# Patient Record
Sex: Female | Born: 2000 | Race: White | Hispanic: No | Marital: Married | Smoking: Never smoker
Health system: Southern US, Community
[De-identification: ages and names within clinical notes are randomized; demographics above are authoritative.]

## PROBLEM LIST (undated history)

## (undated) DIAGNOSIS — D649 Anemia, unspecified: Secondary | ICD-10-CM

## (undated) DIAGNOSIS — F32A Depression, unspecified: Secondary | ICD-10-CM

## (undated) DIAGNOSIS — R519 Headache, unspecified: Secondary | ICD-10-CM

## (undated) DIAGNOSIS — F419 Anxiety disorder, unspecified: Secondary | ICD-10-CM

## (undated) DIAGNOSIS — R51 Headache: Secondary | ICD-10-CM

## (undated) HISTORY — DX: Depression, unspecified: F32.A

## (undated) HISTORY — DX: Anemia, unspecified: D64.9

## (undated) HISTORY — DX: Anxiety disorder, unspecified: F41.9

---
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
## 2018-05-04 ENCOUNTER — Encounter: Payer: Self-pay | Admitting: Neurology

## 2018-05-04 ENCOUNTER — Ambulatory Visit (INDEPENDENT_AMBULATORY_CARE_PROVIDER_SITE_OTHER): Payer: Medicaid Other | Admitting: Neurology

## 2018-05-04 ENCOUNTER — Other Ambulatory Visit: Payer: Self-pay

## 2018-05-04 VITALS — BP 104/70 | HR 91 | Ht 61.0 in | Wt 167.0 lb

## 2018-05-04 DIAGNOSIS — G43009 Migraine without aura, not intractable, without status migrainosus: Secondary | ICD-10-CM | POA: Diagnosis not present

## 2018-05-04 DIAGNOSIS — G43109 Migraine with aura, not intractable, without status migrainosus: Secondary | ICD-10-CM

## 2018-05-04 MED ORDER — SERTRALINE HCL 25 MG PO TABS: 25.0000 mg | ORAL_TABLET | Freq: Every day | ORAL | 3 refills | Status: DC

## 2018-05-04 NOTE — Patient Instructions (Signed)
Great seeing you! Resume Zoloft 25mg  daily. Follow-up in 1 year, call for any changes.

## 2018-05-04 NOTE — Progress Notes (Signed)
NEUROLOGY FOLLOW UP OFFICE NOTE  Miranda Prince [id number] [birth year]  HISTORY OF PRESENT ILLNESS: I had the pleasure of seeing Miranda Prince in follow-up in the neurology clinic on 05/04/2018.  The patient was last seen a year ago for migraines and dizziness. She is alone in the office today. On her last visit, she reported good response to Zoloft but stopping it when she was pregnant. Off medication, migraines and dizziness recurred. She was restarted on Zoloft 25mg  daily and did well until she ran out of medication last month. Since then, she is starting to feel the migraines again. She denied any side effects on Zoloft. She denies any diplopia, focal numbness/tingling/weakness. Sleep is good. She fell at work yesterday, no injuries.   HPI 06/10/2016: This is an 18 yo RH woman who presented with worsening headaches and dizziness. She reports symptoms started intermittently at age 61, but have gotten worse the past 4 weeks, now occurring on a daily basis. She has throbbing headaches mostly over the right parietal region, but also over the frontal/vertex region, with associated nausea and dizziness. She describes the dizziness as either the room spinning or lightheadedness when she walks. Initially they were occurring intermittently with good response to Ibuprofen, however she states that over the past 4 weeks, symptoms have been near-constant. She would have headaches on a daily basis with an intensity of 4 or 5 over 10. The dizziness comes and goes. She has difficulty stating the duration, they mostly worsen when she is doing activities, which has mad it difficult for her to do anything at school. She does report that her grades are fine. She does not take any medication now for these. There is no photo/phonophobia, vomiting, visual obscurations, tinnitus, focal numbness/tingling/weakness in her extremities, but she does have tingling on the right parietal region when it throbs. She has neck and  back pain, no diplopia, dysarthria, bowel/bladder dysfunction. She has noticed some trouble swallowing the past 2 weeks. Her mother used to have migraines. She has not had any brain imaging in the past. She reports mood is "neutral." She reports good sleep except when she has headaches, appetite is good.    PAST MEDICAL HISTORY: Past Medical History:  Diagnosis Date  . Headache     MEDICATIONS: Current Outpatient Medications on File Prior to Visit  Medication Sig Dispense Refill  . calcium carbonate (TUMS - DOSED IN MG ELEMENTAL CALCIUM) 500 MG chewable tablet Chew 1-2 tablets by mouth daily as needed for indigestion or heartburn.    Marland Kitchen ibuprofen (ADVIL,MOTRIN) 600 MG tablet Take 1 tablet (600 mg total) by mouth every 6 (six) hours. (Patient not taking: Reported on 06/10/2017) 60 tablet 0  . sertraline (ZOLOFT) 25 MG tablet Take 1 tablet (25 mg total) by mouth daily. 30 tablet 6   No current facility-administered medications on file prior to visit.     ALLERGIES: No Known Allergies  FAMILY HISTORY: Family History  Problem Relation Age of Onset  . Diabetes Other   . Cancer Other     SOCIAL HISTORY: Social History   Socioeconomic History  . Marital status: Married    Spouse name: Not on file  . Number of children: Not on file  . Years of education: Not on file  . Highest education level: Not on file  Occupational History  . Not on file  Social Needs  . Financial resource strain: Not on file  . Food insecurity:    Worry: Not on file  Inability: Not on file  . Transportation needs:    Medical: Not on file    Non-medical: Not on file  Tobacco Use  . Smoking status: Never Smoker  . Smokeless tobacco: Never Used  Substance and Sexual Activity  . Alcohol use: No  . Drug use: No  . Sexual activity: Not Currently  Lifestyle  . Physical activity:    Days per week: Not on file    Minutes per session: Not on file  . Stress: Not on file  Relationships  . Social  connections:    Talks on phone: Not on file    Gets together: Not on file    Attends religious service: Not on file    Active member of club or organization: Not on file    Attends meetings of clubs or organizations: Not on file    Relationship status: Not on file  . Intimate partner violence:    Fear of current or ex partner: Not on file    Emotionally abused: Not on file    Physically abused: Not on file    Forced sexual activity: Not on file  Other Topics Concern  . Not on file  Social History Narrative  . Not on file    REVIEW OF SYSTEMS: Constitutional: No fevers, chills, or sweats, no generalized fatigue, change in appetite Eyes: No visual changes, double vision, eye pain Ear, nose and throat: No hearing loss, ear pain, nasal congestion, sore throat Cardiovascular: No chest pain, palpitations Respiratory:  No shortness of breath at rest or with exertion, wheezes GastrointestinaI: No nausea, vomiting, diarrhea, abdominal pain, fecal incontinence Genitourinary:  No dysuria, urinary retention or frequency Musculoskeletal:  No neck pain, back pain Integumentary: No rash, pruritus, skin lesions Neurological: as above Psychiatric: No depression, insomnia, anxiety Endocrine: No palpitations, fatigue, diaphoresis, mood swings, change in appetite, change in weight, increased thirst Hematologic/Lymphatic:  No anemia, purpura, petechiae. Allergic/Immunologic: no itchy/runny eyes, nasal congestion, recent allergic reactions, rashes  PHYSICAL EXAM: Vitals:   05/04/18 1558  BP: 104/70  Pulse: 91  SpO2: 99%   General: No acute distress Head:  Normocephalic/atraumatic Neck: supple, no paraspinal tenderness, full range of motion Heart:  Regular rate and rhythm Lungs:  Clear to auscultation bilaterally Back: No paraspinal tenderness Skin/Extremities: No rash, no edema Neurological Exam: alert and oriented to person, place, and time. No aphasia or dysarthria. Fund of knowledge is  appropriate.  Recent and remote memory are intact.  Attention and concentration are normal.    Able to name objects and repeat phrases. Cranial nerves: Pupils equal, round, reactive to light.  Extraocular movements intact with no nystagmus. Visual fields full. Facial sensation intact. No facial asymmetry. Tongue, uvula, palate midline.  Motor: Bulk and tone normal, muscle strength 5/5 throughout with no pronator drift.  Sensation to light touch intact.  No extinction to double simultaneous stimulation.  Deep tendon reflexes 2+ throughout, toes downgoing.  Finger to nose testing intact.  Gait narrow-based and steady, able to tandem walk adequately.  Romberg negative.  IMPRESSION: This is a 18 yo RH woman with a history of headaches and dizziness since childhood suggestive of migraines without aura, who initially presented in 2018 with a change in headaches and dizziness that were occurring on a daily basis. MRI brain normal. 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 She had excellent response to Zoloft but ran out last month. She will restart Zoloft 25mg  daily, refills sent. Follow-up in 1 year, she knows to call for any  changes.   Thank you for allowing me to participate in her care.  Please do not hesitate to call for any questions or concerns.  The duration of this appointment visit was 15 minutes of face-to-face time with the patient.  Greater than 50% of this time was spent in counseling, explanation of diagnosis, planning of further management, and coordination of care.   Patrcia Dolly, M.D.   CC: [facility] Department

## 2018-05-05 ENCOUNTER — Encounter: Payer: Self-pay | Admitting: Neurology

## 2018-05-28 ENCOUNTER — Ambulatory Visit (INDEPENDENT_AMBULATORY_CARE_PROVIDER_SITE_OTHER): Payer: Medicaid Other | Admitting: Physician Assistant

## 2018-05-28 ENCOUNTER — Encounter: Payer: Self-pay | Admitting: Physician Assistant

## 2018-05-28 ENCOUNTER — Other Ambulatory Visit: Payer: Self-pay

## 2018-05-28 ENCOUNTER — Other Ambulatory Visit (HOSPITAL_COMMUNITY)
Admission: RE | Admit: 2018-05-28 | Discharge: 2018-05-28 | Disposition: A | Discharge status: Home / Self Care | Payer: Self-pay | Source: Ambulatory Visit | Attending: Physician Assistant | Admitting: Physician Assistant

## 2018-05-28 VITALS — BP 101/68 | HR 80 | Temp 98.6°F | Wt 164.4 lb

## 2018-05-28 DIAGNOSIS — Z23 Encounter for immunization: Secondary | ICD-10-CM | POA: Diagnosis not present

## 2018-05-28 DIAGNOSIS — Z0000 Encounter for general adult medical examination without abnormal findings: Secondary | ICD-10-CM

## 2018-05-28 DIAGNOSIS — Z113 Encounter for screening for infections with a predominantly sexual mode of transmission: Secondary | ICD-10-CM | POA: Diagnosis not present

## 2018-05-28 DIAGNOSIS — N912 Amenorrhea, unspecified: Secondary | ICD-10-CM

## 2018-05-28 NOTE — Progress Notes (Signed)
Patient: Miranda Prince, Female    DOB: 2001-01-31, 18 y.o.   MRN: [id number] Visit Date: 05/28/2018  Today's Provider: Trey Sailors, PA-C   Chief Complaint  Patient presents with  . New Patient (Initial Visit)   Subjective:    New Patient Appointment Miranda Prince is a 18 y.o. female who presents today for new patient appointment. She feels well. She reports exercising trying. She reports she is sleeping well.  Daughter aged 1. Living in [place], husband and daughter. Hostess at [facility] Neurologist Dr. Patrcia Dolly at [facility] in [place] follows her for dizziness, migraines, gives her zoloft 25 mg for migraines and dizziness for greater than one year. Has stopped when she was pregnant. This helps her.  Sexually active. Does not use any birth control, does not use condoms. Does not desire birth control.  Amenorrhea December started on 26 and ended 29. Reports she has not had period since this. Took five pregnancy tests which were negative. Reports they will be late period sometimes, then sometimes every 2 weeks. First period at age 57 and had normal periods. Just one child, Did have periods after childbirth. Not breast feeding. -----------------------------------------------------------------  Review of Systems  Constitutional: Negative.   HENT: Negative.   Eyes: Negative.   Respiratory: Negative.   Cardiovascular: Negative.   Gastrointestinal: Positive for anal bleeding.  Endocrine: Negative.   Genitourinary: Negative.   Musculoskeletal: Negative.   Skin: Negative.   Allergic/Immunologic: Negative.   Neurological: Negative.   Hematological: Negative.   Psychiatric/Behavioral: Negative.     Social History She  reports that she has never smoked. She has never used smokeless tobacco. She reports that she does not drink alcohol or use drugs. Social History   Socioeconomic History  . Marital status: Married    Spouse name: Not on file   . Number of children: Not on file  . Years of education: Not on file  . Highest education level: Not on file  Occupational History  . Not on file  Social Needs  . Financial resource strain: Not on file  . Food insecurity:    Worry: Not on file    Inability: Not on file  . Transportation needs:    Medical: Not on file    Non-medical: Not on file  Tobacco Use  . Smoking status: Never Smoker  . Smokeless tobacco: Never Used  Substance and Sexual Activity  . Alcohol use: No  . Drug use: No  . Sexual activity: Not Currently  Lifestyle  . Physical activity:    Days per week: Not on file    Minutes per session: Not on file  . Stress: Not on file  Relationships  . Social connections:    Talks on phone: Not on file    Gets together: Not on file    Attends religious service: Not on file    Active member of club or organization: Not on file    Attends meetings of clubs or organizations: Not on file    Relationship status: Not on file  Other Topics Concern  . Not on file  Social History Narrative  . Not on file    Patient Active Problem List   Diagnosis Date Noted  . PROM (premature rupture of membranes) 04/08/2017  . SVD (spontaneous vaginal delivery) 04/08/2017  . Migraine without aura and without status migrainosus, not intractable 06/10/2016  . 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 No pertinent surgical history.  Family  History  Family Status  Relation Name Status  . Other  (Not Specified)  . Mother  (Not Specified)  . Mat Uncle  (Not Specified)  . Emelda Brothers  (Not Specified)  . MGM  (Not Specified)  . PGF  (Not Specified)   Her family history includes Anxiety disorder in her paternal aunt; Asthma in her paternal aunt; COPD in her paternal aunt; Cancer in an other family member; Depression in her paternal aunt; Diabetes in her maternal uncle, mother, and another family member; Seizures in her maternal grandmother; Stroke in her paternal grandfather.      No Known Allergies  Previous Medications   CALCIUM CARBONATE (TUMS - DOSED IN MG ELEMENTAL CALCIUM) 500 MG CHEWABLE TABLET    Chew 1-2 tablets by mouth daily as needed for indigestion or heartburn.   IBUPROFEN (ADVIL,MOTRIN) 600 MG TABLET    Take 1 tablet (600 mg total) by mouth every 6 (six) hours.   SERTRALINE (ZOLOFT) 25 MG TABLET    Take 1 tablet (25 mg total) by mouth daily.    Patient Care Team: Department, [facility] as PCP - General      Objective:   Vitals: BP 101/68 (BP Location: Left Arm, Patient Position: Sitting, Cuff Size: Normal)   Pulse 80   Temp 98.6 F (37 C) (Oral)   Wt 164 lb 6.4 oz (74.6 kg)   SpO2 99%   BMI 31.06 kg/m    Physical Exam Constitutional:      Appearance: Normal appearance.  Cardiovascular:     Rate and Rhythm: Normal rate and regular rhythm.     Heart sounds: Normal heart sounds.  Pulmonary:     Effort: Pulmonary effort is normal.     Breath sounds: Normal breath sounds.  Abdominal:     General: Abdomen is flat.     Palpations: Abdomen is soft.  Skin:    General: Skin is warm and dry.  Neurological:     Mental Status: She is alert and oriented to person, place, and time. Mental status is at baseline.  Psychiatric:        Mood and Affect: Mood normal.        Behavior: Behavior normal.      Depression Screen PHQ 2/9 Scores 05/28/2018  PHQ - 2 Score 0  PHQ- 9 Score 0      Assessment & Plan:     Routine Health Maintenance and Physical Exam  Exercise Activities and Dietary recommendations Goals   None     There is no immunization history for the selected administration types on file for this patient.  Health Maintenance  Topic Date Due  . TETANUS/TDAP  04/28/2017  . INFLUENZA VACCINE  10/16/2017  . HIV Screening  Completed     Discussed health benefits of physical activity, and encouraged her to engage in regular exercise appropriate for her age and condition.     --------------------------------------------------------------------

## 2018-05-30 LAB — COMPREHENSIVE METABOLIC PANEL
ALT: 22 IU/L (ref 0–32)
AST: 22 IU/L (ref 0–40)
Albumin/Globulin Ratio: 1.4 (ref 1.2–2.2)
Albumin: 4.5 g/dL (ref 3.9–5.0)
Alkaline Phosphatase: 99 IU/L (ref 39–117)
BUN/Creatinine Ratio: 15 (ref 9–23)
BUN: 11 mg/dL (ref 6–20)
Bilirubin Total: 0.5 mg/dL (ref 0.0–1.2)
CO2: 22 mmol/L (ref 20–29)
Calcium: 9.3 mg/dL (ref 8.7–10.2)
Chloride: 102 mmol/L (ref 96–106)
Creatinine, Ser: 0.71 mg/dL (ref 0.57–1.00)
GFR calc Af Amer: 142 mL/min/{1.73_m2} (ref >59)
GFR calc non Af Amer: 123 mL/min/{1.73_m2} (ref >59)
Globulin, Total: 3.3 g/dL (ref 1.5–4.5)
Glucose: 82 mg/dL (ref 65–99)
Potassium: 4.2 mmol/L (ref 3.5–5.2)
Sodium: 139 mmol/L (ref 134–144)
Total Protein: 7.8 g/dL (ref 6.0–8.5)

## 2018-05-30 LAB — CBC WITH DIFFERENTIAL/PLATELET
Basophils Absolute: 0.1 10*3/uL (ref 0.0–0.2)
Basos: 1 %
EOS (ABSOLUTE): 0.3 10*3/uL (ref 0.0–0.4)
Eos: 4 %
Hematocrit: 38.8 % (ref 34.0–46.6)
Hemoglobin: 13.1 g/dL (ref 11.1–15.9)
Immature Grans (Abs): 0 10*3/uL (ref 0.0–0.1)
Immature Granulocytes: 0 %
Lymphocytes Absolute: 2.3 10*3/uL (ref 0.7–3.1)
Lymphs: 32 %
MCH: 27.3 pg (ref 26.6–33.0)
MCHC: 33.8 g/dL (ref 31.5–35.7)
MCV: 81 fL (ref 79–97)
Monocytes Absolute: 0.5 10*3/uL (ref 0.1–0.9)
Monocytes: 7 %
Neutrophils Absolute: 4 10*3/uL (ref 1.4–7.0)
Neutrophils: 56 %
Platelets: 381 10*3/uL (ref 150–450)
RBC: 4.79 x10E6/uL (ref 3.77–5.28)
RDW: 13.4 % (ref 11.7–15.4)
WBC: 7.1 10*3/uL (ref 3.4–10.8)

## 2018-05-30 LAB — LUTEINIZING HORMONE: LH: 18.6 m[IU]/mL

## 2018-05-30 LAB — PROLACTIN: Prolactin: 19.3 ng/mL (ref 4.8–23.3)

## 2018-05-30 LAB — BETA HCG QUANT (REF LAB): hCG Quant: <1 m[IU]/mL

## 2018-05-30 LAB — DHEA: Dehydroepiandrosterone: 1928 ng/dL — ABNORMAL HIGH (ref 31–701)

## 2018-05-30 LAB — TSH: TSH: 0.756 u[IU]/mL (ref 0.450–4.500)

## 2018-05-30 LAB — FOLLICLE STIMULATING HORMONE: FSH: 6.1 m[IU]/mL

## 2018-05-31 NOTE — Patient Instructions (Addendum)

## 2018-06-01 LAB — URINE CYTOLOGY ANCILLARY ONLY
Chlamydia: NEGATIVE
Neisseria Gonorrhea: NEGATIVE

## 2018-06-04 ENCOUNTER — Telehealth: Payer: Self-pay | Admitting: Physician Assistant

## 2018-06-04 NOTE — Telephone Encounter (Signed)
LMTCB

## 2018-06-04 NOTE — Telephone Encounter (Signed)
Can you results her labs please. Thanks.

## 2018-06-04 NOTE — Telephone Encounter (Signed)
-----   Message from Trey Sailors, [place] sent at 06/04/2018  4:48 PM EDT ----- Pregnancy test negative. Most of her hormones were normal except for one, DHEA, which was elevated. I think she should be referred to an endocrinologist for further evaluation of why this is elevated. I can place referral.

## 2018-06-04 NOTE — Telephone Encounter (Signed)
Please call pt back regarding her recent lab results.  Thanks, [employer]

## 2018-06-05 ENCOUNTER — Encounter: Payer: Self-pay | Admitting: Physician Assistant

## 2018-06-05 NOTE — Telephone Encounter (Signed)
Replied through my chart message.

## 2018-06-06 ENCOUNTER — Encounter: Payer: Self-pay | Admitting: Physician Assistant

## 2018-06-22 ENCOUNTER — Encounter: Payer: Self-pay | Admitting: Physician Assistant

## 2018-06-22 DIAGNOSIS — R7989 Other specified abnormal findings of blood chemistry: Secondary | ICD-10-CM

## 2018-06-22 DIAGNOSIS — N912 Amenorrhea, unspecified: Secondary | ICD-10-CM

## 2018-06-22 DIAGNOSIS — E278 Other specified disorders of adrenal gland: Secondary | ICD-10-CM

## 2018-07-28 ENCOUNTER — Encounter: Payer: Self-pay | Admitting: Physician Assistant

## 2018-12-11 ENCOUNTER — Ambulatory Visit (INDEPENDENT_AMBULATORY_CARE_PROVIDER_SITE_OTHER): Payer: Medicaid Other | Admitting: Certified Nurse Midwife

## 2018-12-11 ENCOUNTER — Other Ambulatory Visit: Payer: Self-pay

## 2018-12-11 VITALS — BP 109/73 | HR 97 | Ht 61.0 in | Wt 164.5 lb

## 2018-12-11 DIAGNOSIS — Z3401 Encounter for supervision of normal first pregnancy, first trimester: Secondary | ICD-10-CM

## 2018-12-11 NOTE — Progress Notes (Signed)
Aldona Bar Gladu presents for NOB nurse interview visit. Pregnancy confirmation done 12/10/2018 G2 P1001 Pregnancy education material explained and given. 0 cats in home. NOB labs ordered.  HIV labs and drug screen were explained and ordered. PNV encouraged. Genetic screening options discussed. Genetic testing: Unsure. Patient may discuss with the provider. Patient to follow up with provider in 2 weeks for NOB physical. All questions answered.  Pregnancy Confirmation was done at [facility] Department. Document will be scanned in to. Patient is taking OTC prenatal vitamins.

## 2018-12-12 LAB — URINALYSIS, ROUTINE W REFLEX MICROSCOPIC
Bilirubin, UA: NEGATIVE
Glucose, UA: NEGATIVE
Ketones, UA: NEGATIVE
Nitrite, UA: POSITIVE — AB
Protein,UA: NEGATIVE
RBC, UA: NEGATIVE
Specific Gravity, UA: 1.018 (ref 1.005–1.030)
Urobilinogen, Ur: 0.2 mg/dL (ref 0.2–1.0)
pH, UA: 6 (ref 5.0–7.5)

## 2018-12-12 LAB — MICROSCOPIC EXAMINATION: Casts: NONE SEEN /lpf

## 2018-12-13 LAB — RPR: RPR Ser Ql: NONREACTIVE

## 2018-12-13 LAB — ABO AND RH: Rh Factor: POSITIVE

## 2018-12-13 LAB — RUBELLA SCREEN: Rubella Antibodies, IGG: 7.09 index (ref >0.99)

## 2018-12-13 LAB — HEPATITIS B SURFACE ANTIGEN: Hepatitis B Surface Ag: NEGATIVE

## 2018-12-13 LAB — HIV ANTIBODY (ROUTINE TESTING W REFLEX): HIV Screen 4th Generation wRfx: NONREACTIVE

## 2018-12-13 LAB — ANTIBODY SCREEN: Antibody Screen: NEGATIVE

## 2018-12-13 LAB — VARICELLA ZOSTER ANTIBODY, IGG: Varicella zoster IgG: 3010 index (ref >165)

## 2018-12-14 LAB — MONITOR DRUG PROFILE 14(MW)
Amphetamine Scrn, Ur: NEGATIVE ng/mL
BARBITURATE SCREEN URINE: NEGATIVE ng/mL
BENZODIAZEPINE SCREEN, URINE: NEGATIVE ng/mL
Buprenorphine, Urine: NEGATIVE ng/mL
CANNABINOIDS UR QL SCN: NEGATIVE ng/mL
Cocaine (Metab) Scrn, Ur: NEGATIVE ng/mL
Creatinine(Crt), U: 85.6 mg/dL (ref 20.0–300.0)
Fentanyl, Urine: NEGATIVE pg/mL
Meperidine Screen, Urine: NEGATIVE ng/mL
Methadone Screen, Urine: NEGATIVE ng/mL
OXYCODONE+OXYMORPHONE UR QL SCN: NEGATIVE ng/mL
Opiate Scrn, Ur: NEGATIVE ng/mL
Ph of Urine: 6.7 (ref 4.5–8.9)
Phencyclidine Qn, Ur: NEGATIVE ng/mL
Propoxyphene Scrn, Ur: NEGATIVE ng/mL
SPECIFIC GRAVITY: 1.021
Tramadol Screen, Urine: NEGATIVE ng/mL

## 2018-12-15 ENCOUNTER — Other Ambulatory Visit (INDEPENDENT_AMBULATORY_CARE_PROVIDER_SITE_OTHER): Payer: Self-pay | Admitting: Certified Nurse Midwife

## 2018-12-15 ENCOUNTER — Encounter: Payer: Self-pay | Admitting: Certified Nurse Midwife

## 2018-12-15 DIAGNOSIS — B9620 Unspecified Escherichia coli [E. coli] as the cause of diseases classified elsewhere: Secondary | ICD-10-CM | POA: Insufficient documentation

## 2018-12-15 DIAGNOSIS — N390 Urinary tract infection, site not specified: Secondary | ICD-10-CM

## 2018-12-15 LAB — GC/CHLAMYDIA PROBE AMP
Chlamydia trachomatis, NAA: NEGATIVE
Neisseria Gonorrhoeae by PCR: NEGATIVE

## 2018-12-15 LAB — CULTURE, OB URINE

## 2018-12-15 LAB — URINE CULTURE, OB REFLEX

## 2018-12-15 MED ORDER — NITROFURANTOIN MONOHYD MACRO 100 MG PO CAPS: 100.0000 mg | ORAL_CAPSULE | Freq: Two times a day (BID) | ORAL | 1 refills | Status: DC

## 2018-12-15 NOTE — Progress Notes (Signed)
E.coli UTI, see chart. Rx: Macrobid, see orders.    Diona Fanti, CNM [facility], [facility] 12/15/18 1:53 PM

## 2018-12-24 ENCOUNTER — Ambulatory Visit (INDEPENDENT_AMBULATORY_CARE_PROVIDER_SITE_OTHER): Payer: Medicaid Other | Admitting: Certified Nurse Midwife

## 2018-12-24 ENCOUNTER — Encounter: Payer: Self-pay | Admitting: Certified Nurse Midwife

## 2018-12-24 ENCOUNTER — Ambulatory Visit (INDEPENDENT_AMBULATORY_CARE_PROVIDER_SITE_OTHER): Payer: Medicaid Other

## 2018-12-24 ENCOUNTER — Other Ambulatory Visit: Payer: Self-pay

## 2018-12-24 VITALS — BP 90/58 | HR 89 | Wt 163.9 lb

## 2018-12-24 DIAGNOSIS — N390 Urinary tract infection, site not specified: Secondary | ICD-10-CM | POA: Diagnosis not present

## 2018-12-24 DIAGNOSIS — B9620 Unspecified Escherichia coli [E. coli] as the cause of diseases classified elsewhere: Secondary | ICD-10-CM | POA: Diagnosis not present

## 2018-12-24 DIAGNOSIS — O99891 Other specified diseases and conditions complicating pregnancy: Secondary | ICD-10-CM | POA: Diagnosis not present

## 2018-12-24 DIAGNOSIS — Z3687 Encounter for antenatal screening for uncertain dates: Secondary | ICD-10-CM

## 2018-12-24 DIAGNOSIS — Z3401 Encounter for supervision of normal first pregnancy, first trimester: Secondary | ICD-10-CM

## 2018-12-24 DIAGNOSIS — Z3491 Encounter for supervision of normal pregnancy, unspecified, first trimester: Secondary | ICD-10-CM

## 2018-12-24 DIAGNOSIS — Z3A12 12 weeks gestation of pregnancy: Secondary | ICD-10-CM | POA: Diagnosis not present

## 2018-12-24 LAB — POCT URINALYSIS DIPSTICK OB
Bilirubin, UA: NEGATIVE
Glucose, UA: NEGATIVE
Ketones, UA: NEGATIVE
Nitrite, UA: POSITIVE
POC,PROTEIN,UA: NEGATIVE
Spec Grav, UA: 1.015 (ref 1.010–1.025)
Urobilinogen, UA: 0.2 E.U./dL
pH, UA: 6 (ref 5.0–8.0)

## 2018-12-24 NOTE — Progress Notes (Addendum)
NEW OB HISTORY AND PHYSICAL  SUBJECTIVE:       Miranda Prince is a 18 y.o. G39P1001 female, Patient's last menstrual period was 10/05/2018., Estimated Date of Delivery: 07/07/19, [redacted]w[redacted]d, presents today for establishment of Prenatal Care.  No questions or concerns. Declines genetic screening. Desires midwifery care.   Taking Zoloft for migraines, wishes to continue medication through pregnancy.   Denies difficulty breathing or respiratory distress, chest pain, abdominal pain, vaginal bleeding, dysuria, and leg pain or swelling.    Gynecologic History  Patient's last menstrual period was 10/05/2018.   Contraception: none   Last Pap: N/A  Obstetric History  OB History  Gravida Para Term Preterm AB Living  2 1 1     1   SAB TAB Ectopic Multiple Live Births        0 1    # Outcome Date GA Lbr Len/2nd Weight Sex Delivery Anes PTL Lv  2 Current           1 Term 04/08/17 [redacted]w[redacted]d / 00:26 7 lb 2.8 oz (3.255 kg) F Vag-Spont EPI  LIV    Past Medical History:  Diagnosis Date  . Headache     No past surgical history on file.  Current Outpatient Medications on File Prior to Visit  Medication Sig Dispense Refill  . Prenatal Vit-Fe Fumarate-FA (PRENATAL MULTIVITAMIN) TABS tablet Take 1 tablet by mouth daily at 12 noon.    . sertraline (ZOLOFT) 25 MG tablet Take 1 tablet (25 mg total) by mouth daily. 90 tablet 3  . nitrofurantoin, macrocrystal-monohydrate, (MACROBID) 100 MG capsule Take 1 capsule (100 mg total) by mouth 2 (two) times daily. (Patient not taking: Reported on 12/24/2018) 14 capsule 1   No current facility-administered medications on file prior to visit.     No Known Allergies  Social History   Socioeconomic History  . Marital status: Married    Spouse name: Not on file  . Number of children: Not on file  . Years of education: Not on file  . Highest education level: Not on file  Occupational History  . Not on file  Social Needs  . Financial resource strain: Not on  file  . Food insecurity    Worry: Not on file    Inability: Not on file  . Transportation needs    Medical: Not on file    Non-medical: Not on file  Tobacco Use  . Smoking status: Never Smoker  . Smokeless tobacco: Never Used  Substance and Sexual Activity  . Alcohol use: No  . Drug use: No  . Sexual activity: Not Currently  Lifestyle  . Physical activity    Days per week: Not on file    Minutes per session: Not on file  . Stress: Not on file  Relationships  . Social 02/23/2019 on phone: Not on file    Gets together: Not on file    Attends religious service: Not on file    Active member of club or organization: Not on file    Attends meetings of clubs or organizations: Not on file    Relationship status: Not on file  . Intimate partner violence    Fear of current or ex partner: Not on file    Emotionally abused: Not on file    Physically abused: Not on file    Forced sexual activity: Not on file  Other Topics Concern  . Not on file  Social History Narrative  . Not on file  Family History  Problem Relation Age of Onset  . Diabetes Other   . Cancer Other   . Diabetes Mother   . Diabetes Maternal Uncle   . Depression Paternal Aunt   . Asthma Paternal Aunt   . COPD Paternal 32   . Anxiety disorder Paternal Aunt   . Seizures Maternal Grandmother   . Stroke Paternal Grandfather     The following portions of the patient's history were reviewed and updated as appropriate: allergies, current medications, past OB history, past medical history, past surgical history, past family history, past social history, and problem list.  Review of Systems:  ROS negative except as noted above. Information obtained from patient.   OBJECTIVE:  BP (!) 90/58   Pulse 89   Wt 163 lb 14.4 oz (74.3 kg)   LMP 10/05/2018   BMI 30.97 kg/m   Initial Physical Exam (New OB)  GENERAL APPEARANCE: alert, well appearing, in no apparent distress  HEAD: normocephalic,  atraumatic  MOUTH: mucous membranes moist, pharynx normal without lesions  THYROID: no thyromegaly or masses present  BREASTS: no masses noted, no significant tenderness, no palpable axillary nodes, no skin changes  LUNGS: clear to auscultation, no wheezes, rales or rhonchi, symmetric air entry  HEART: regular rate and rhythm, no murmurs  ABDOMEN: soft, nontender, nondistended, no abnormal masses, no epigastric pain and FHT present  EXTREMITIES: no redness or tenderness in the calves or thighs, no edema  SKIN: normal coloration and turgor, no rashes  LYMPH NODES: no adenopathy palpable  NEUROLOGIC: alert, oriented, normal speech, no focal findings or movement disorder noted  PELVIC EXAM EXTERNAL GENITALIA: normal appearing vulva with no masses, tenderness or lesions VAGINA: no abnormal discharge or lesions CERVIX: no lesions or cervical motion tenderness UTERUS: gravid ADNEXA: no masses palpable and nontender OB EXAM PELVIMETRY: appears adequate   ULTRASOUND REPORT  Location: [facility] Date of Service: 12/24/2018   Indications:dating Findings:  Miranda Prince intrauterine pregnancy is visualized with a CRL consistent with [redacted]w[redacted]d gestation, giving an (U/S) EDD of 07/07/2019.  FHR: 165 BPM CRL measurement: 49 mm Yolk sac is visualized and appears normal and early anatomy is normal. Amnion: visualized and appears normal   Right Ovary is normal in appearance. Left Ovary is normal appearance. Corpus luteal cyst:  is not visualized Survey of the adnexa demonstrates no adnexal masses. There is no free peritoneal fluid in the cul de sac.  Impression: 1. [redacted]w[redacted]d Viable Singleton Intrauterine pregnancy by U/S. 2. (U/S) EDD is 07/07/2019.  Recommendations: 1.Clinical correlation with the patient's History and Physical Exam. ASSESSMENT: Normal pregnancy Urinary tract infection in pregnancy-Rx Macrobid Declines genetic screening Desires midwifery care Migraines-Taking  Zoloft daily  PLAN: Prenatal care New OB counseling: The patient has been given an overview regarding routine prenatal care. Recommendations regarding diet, weight gain, and exercise in pregnancy were given. Prenatal testing, optional genetic testing, and ultrasound use in pregnancy were reviewed.  Benefits of Breast Feeding were discussed. The patient is encouraged to consider nursing her baby post partum. See orders

## 2018-12-24 NOTE — Progress Notes (Signed)
Patient here for NOB physical, no complaints.

## 2018-12-24 NOTE — Patient Instructions (Signed)
Eating Plan for Pregnant Women While you are pregnant, your body requires additional nutrition to help support your growing baby. You also have a higher need for some vitamins and minerals, such as folic acid, calcium, iron, and vitamin D. Eating a healthy, well-balanced diet is very important for your health and your baby's health. Your need for extra calories varies for the three 53-monthsegments of your pregnancy (trimesters). For most women, it is recommended to consume:  150 extra calories a day during the first trimester.  300 extra calories a day during the second trimester.  300 extra calories a day during the third trimester. What are tips for following this plan?   Do not try to lose weight or go on a diet during pregnancy.  Limit your overall intake of foods that have "empty calories." These are foods that have little nutritional value, such as sweets, desserts, candies, and sugar-sweetened beverages.  Eat a variety of foods (especially fruits and vegetables) to get a full range of vitamins and minerals.  Take a prenatal vitamin to help meet your additional vitamin and mineral needs during pregnancy, specifically for folic acid, iron, calcium, and vitamin D.  Remember to stay active. Ask your health care provider what types of exercise and activities are safe for you.  Practice good food safety and cleanliness. Wash your hands before you eat and after you prepare raw meat. Wash all fruits and vegetables well before peeling or eating. Taking these actions can help to prevent food-borne illnesses that can be very dangerous to your baby, such as listeriosis. Ask your health care provider for more information about listeriosis. What does 150 extra calories look like? Healthy options that provide 150 extra calories each day could be any of the following:  6-8 oz (170-230 g) of plain low-fat yogurt with  cup of berries.  1 apple with 2 teaspoons (11 g) of peanut butter.  Cut-up  vegetables with  cup (60 g) of hummus.  8 oz (230 mL) or 1 cup of low-fat chocolate milk.  1 stick of string cheese with 1 medium orange.  1 peanut butter and jelly sandwich that is made with one slice of whole-wheat bread and 1 tsp (5 g) of peanut butter. For 300 extra calories, you could eat two of those healthy options each day. What is a healthy amount of weight to gain? The right amount of weight gain for you is based on your BMI before you became pregnant. If your BMI:  Was less than 18 (underweight), you should gain 28-40 lb (13-18 kg).  Was 18-24.9 (normal), you should gain 25-35 lb (11-16 kg).  Was 25-29.9 (overweight), you should gain 15-25 lb (7-11 kg).  Was 30 or greater (obese), you should gain 11-20 lb (5-9 kg). What if I am having twins or multiples? Generally, if you are carrying twins or multiples:  You may need to eat 300-600 extra calories a day.  The recommended range for total weight gain is 25-54 lb (11-25 kg), depending on your BMI before pregnancy.  Talk with your health care provider to find out about nutritional needs, weight gain, and exercise that is right for you. What foods can I eat?  Grains All grains. Choose whole grains, such as whole-wheat bread, oatmeal, or brown rice. Vegetables All vegetables. Eat a variety of colors and types of vegetables. Remember to wash your vegetables well before peeling or eating. Fruits All fruits. Eat a variety of colors and types of fruit. Remember to wash  your fruits well before peeling or eating. Meats and other protein foods Lean meats, including chicken, [place], fish, and lean cuts of beef, veal, or pork. If you eat fish or seafood, choose options that are higher in omega-3 fatty acids and lower in mercury, such as salmon, herring, mussels, trout, sardines, pollock, shrimp, crab, and lobster. Tofu. Tempeh. Beans. Eggs. Peanut butter and other nut butters. Make sure that all meats, poultry, and eggs are cooked to  food-safe temperatures or "well-done." Two or more servings of fish are recommended each week in order to get the most benefits from omega-3 fatty acids that are found in seafood. Choose fish that are lower in mercury. You can find more information online:  [employer]ch Dairy Pasteurized milk and milk alternatives (such as almond milk). Pasteurized yogurt and pasteurized cheese. Cottage cheese. Sour cream. Beverages Water. Juices that contain 100% fruit juice or vegetable juice. Caffeine-free teas and decaffeinated coffee. Drinks that contain caffeine are okay to drink, but it is better to avoid caffeine. Keep your total caffeine intake to less than 200 mg each day (which is 12 oz or 355 mL of coffee, tea, or soda) or the limit as told by your health care provider. Fats and oils Fats and oils are okay to include in moderation. Sweets and desserts Sweets and desserts are okay to include in moderation. Seasoning and other foods All pasteurized condiments. The items listed above may not be a complete list of recommended foods and beverages. Contact your dietitian for more options. The items listed above may not be a complete list of foods and beverages [you/your child] can eat. Contact a dietitian for more information. What foods are not recommended? Vegetables Raw (unpasteurized) vegetable juices. Fruits Unpasteurized fruit juices. Meats and other protein foods Lunch meats, bologna, hot dogs, or other deli meats. (If you must eat those meats, reheat them until they are steaming hot.) Refrigerated pat, meat spreads from a meat counter, smoked seafood that is found in the refrigerated section of a store. Raw or undercooked meats, poultry, and eggs. Raw fish, such as sushi or sashimi. Fish that have high mercury content, such as tilefish, shark, swordfish, and king mackerel. To learn more about mercury in fish, talk with your health care provider or look for online resources, such  as:  [employer] Dairy Raw (unpasteurized) milk and any foods that have raw milk in them. Soft cheeses, such as feta, queso blanco, queso fresco, Brie, Camembert cheeses, blue-veined cheeses, and Panela cheese (unless it is made with pasteurized milk, which must be stated on the label). Beverages Alcohol. Sugar-sweetened beverages, such as sodas, teas, or energy drinks. Seasoning and other foods Homemade fermented foods and drinks, such as pickles, sauerkraut, or kombucha drinks. (Store-bought pasteurized versions of these are okay.) Salads that are made in a store or deli, such as ham salad, chicken salad, egg salad, tuna salad, and seafood salad. The items listed above may not be a complete list of foods and beverages to avoid. Contact your dietitian for more information. The items listed above may not be a complete list of foods and beverages [you/your child] should avoid. Contact a dietitian for more information. Where to find more information To calculate the number of calories you need based on your height, weight, and activity level, you can use an online calculator such as:  MobileTransition.ch To calculate how much weight you should gain during pregnancy, you can use an online pregnancy weight gain calculator such as:  StreamingFood.com.cy Summary  While you  are pregnant, your body requires additional nutrition to help support your growing baby.  Eat a variety of foods, especially fruits and vegetables to get a full range of vitamins and minerals.  Practice good food safety and cleanliness. Wash your hands before you eat and after you prepare raw meat. Wash all fruits and vegetables well before peeling or eating. Taking these actions can help to prevent food-borne illnesses, such as listeriosis, that can be very dangerous to your baby.  Do not eat raw meat or fish. Do not eat fish that have high mercury content, such as tilefish,  shark, swordfish, and king mackerel. Do not eat unpasteurized (raw) dairy.  Take a prenatal vitamin to help meet your additional vitamin and mineral needs during pregnancy, specifically for folic acid, iron, calcium, and vitamin D. This information is not intended to replace advice given to you by your health care provider. Make sure you discuss any questions you have with your health care provider. Document Released: 12/17/2013 Document Revised: 06/25/2018 Document Reviewed: 11/29/2016 Elsevier Patient Education  2020 [employer]. Breastfeeding  Choosing to breastfeed is one of the best decisions you can make for yourself and your baby. A change in hormones during pregnancy causes your breasts to make breast milk in your milk-producing glands. Hormones prevent breast milk from being released before your baby is born. They also prompt milk flow after birth. Once breastfeeding has begun, thoughts of your baby, as well as his or her sucking or crying, can stimulate the release of milk from your milk-producing glands. Benefits of breastfeeding Research shows that breastfeeding offers many health benefits for infants and mothers. It also offers a cost-free and convenient way to feed your baby. For your baby  Your first milk (colostrum) helps your baby's digestive system to function better.  Special cells in your milk (antibodies) help your baby to fight off infections.  Breastfed babies are less likely to develop asthma, allergies, obesity, or type 2 diabetes. They are also at lower risk for sudden infant death syndrome (SIDS).  Nutrients in breast milk are better able to meet your babys needs compared to infant formula.  Breast milk improves your baby's brain development. For you  Breastfeeding helps to create a very special bond between you and your baby.  Breastfeeding is convenient. Breast milk costs nothing and is always available at the correct temperature.  Breastfeeding helps to  burn calories. It helps you to lose the weight that you gained during pregnancy.  Breastfeeding makes your uterus return faster to its size before pregnancy. It also slows bleeding (lochia) after you give birth.  Breastfeeding helps to lower your risk of developing type 2 diabetes, osteoporosis, rheumatoid arthritis, cardiovascular disease, and breast, ovarian, uterine, and endometrial cancer later in life. Breastfeeding basics Starting breastfeeding  Find a comfortable place to sit or lie down, with your neck and back well-supported.  Place a pillow or a rolled-up blanket under your baby to bring him or her to the level of your breast (if you are seated). Nursing pillows are specially designed to help support your arms and your baby while you breastfeed.  Make sure that your baby's tummy (abdomen) is facing your abdomen.  Gently massage your breast. With your fingertips, massage from the outer edges of your breast inward toward the nipple. This encourages milk flow. If your milk flows slowly, you may need to continue this action during the feeding.  Support your breast with 4 fingers underneath and your thumb above your nipple (  make the letter "C" with your hand). Make sure your fingers are well away from your nipple and your babys mouth.  Stroke your baby's lips gently with your finger or nipple.  When your baby's mouth is open wide enough, quickly bring your baby to your breast, placing your entire nipple and as much of the areola as possible into your baby's mouth. The areola is the colored area around your nipple. ? More areola should be visible above your baby's upper lip than below the lower lip. ? Your baby's lips should be opened and extended outward (flanged) to ensure an adequate, comfortable latch. ? Your baby's tongue should be between his or her lower gum and your breast.  Make sure that your baby's mouth is correctly positioned around your nipple (latched). Your baby's lips  should create a seal on your breast and be turned out (everted).  It is common for your baby to suck about 2-3 minutes in order to start the flow of breast milk. Latching Teaching your baby how to latch onto your breast properly is very important. An improper latch can cause nipple pain, decreased milk supply, and poor weight gain in your baby. Also, if your baby is not latched onto your nipple properly, he or she may swallow some air during feeding. This can make your baby fussy. Burping your baby when you switch breasts during the feeding can help to get rid of the air. However, teaching your baby to latch on properly is still the best way to prevent fussiness from swallowing air while breastfeeding. Signs that your baby has successfully latched onto your nipple  Silent tugging or silent sucking, without causing you pain. Infant's lips should be extended outward (flanged).  Swallowing heard between every 3-4 sucks once your milk has started to flow (after your let-down milk reflex occurs).  Muscle movement above and in front of his or her ears while sucking. Signs that your baby has not successfully latched onto your nipple  Sucking sounds or smacking sounds from your baby while breastfeeding.  Nipple pain. If you think your baby has not latched on correctly, slip your finger into the corner of your babys mouth to break the suction and place it between your baby's gums. Attempt to start breastfeeding again. Signs of successful breastfeeding Signs from your baby  Your baby will gradually decrease the number of sucks or will completely stop sucking.  Your baby will fall asleep.  Your baby's body will relax.  Your baby will retain a small amount of milk in his or her mouth.  Your baby will let go of your breast by himself or herself. Signs from you  Breasts that have increased in firmness, weight, and size 1-3 hours after feeding.  Breasts that are softer immediately after  breastfeeding.  Increased milk volume, as well as a change in milk consistency and color by the fifth day of breastfeeding.  Nipples that are not sore, cracked, or bleeding. Signs that your baby is getting enough milk  Wetting at least 1-2 diapers during the first 24 hours after birth.  Wetting at least 5-6 diapers every 24 hours for the first week after birth. The urine should be clear or pale yellow by the age of 5 days.  Wetting 6-8 diapers every 24 hours as your baby continues to grow and develop.  At least 3 stools in a 24-hour period by the age of 5 days. The stool should be soft and yellow.  At least 3 stools  in a 24-hour period by the age of 7 days. The stool should be seedy and yellow.  No loss of weight greater than 10% of birth weight during the first 3 days of life.  Average weight gain of 4-7 oz (113-198 g) per week after the age of 4 days.  Consistent daily weight gain by the age of 5 days, without weight loss after the age of 2 weeks. After a feeding, your baby may spit up a small amount of milk. This is normal. Breastfeeding frequency and duration Frequent feeding will help you make more milk and can prevent sore nipples and extremely full breasts (breast engorgement). Breastfeed when you feel the need to reduce the fullness of your breasts or when your baby shows signs of hunger. This is called "breastfeeding on demand." Signs that your baby is hungry include:  Increased alertness, activity, or restlessness.  Movement of the head from side to side.  Opening of the mouth when the corner of the mouth or cheek is stroked (rooting).  Increased sucking sounds, smacking lips, cooing, sighing, or squeaking.  Hand-to-mouth movements and sucking on fingers or hands.  Fussing or crying. Avoid introducing a pacifier to your baby in the first 4-6 weeks after your baby is born. After this time, you may choose to use a pacifier. Research has shown that pacifier use during the  first year of a baby's life decreases the risk of sudden infant death syndrome (SIDS). Allow your baby to feed on each breast as long as he or she wants. When your baby unlatches or falls asleep while feeding from the first breast, offer the second breast. Because newborns are often sleepy in the first few weeks of life, you may need to awaken your baby to get him or her to feed. Breastfeeding times will vary from baby to baby. However, the following rules can serve as a guide to help you make sure that your baby is properly fed:  Newborns (babies 25 weeks of age or younger) may breastfeed every 1-3 hours.  Newborns should not go without breastfeeding for longer than 3 hours during the day or 5 hours during the night.  You should breastfeed your baby a minimum of 8 times in a 24-hour period. Breast milk pumping     Pumping and storing breast milk allows you to make sure that your baby is exclusively fed your breast milk, even at times when you are unable to breastfeed. This is especially important if you go back to work while you are still breastfeeding, or if you are not able to be present during feedings. Your lactation consultant can help you find a method of pumping that works best for you and give you guidelines about how long it is safe to store breast milk. Caring for your breasts while you breastfeed Nipples can become dry, cracked, and sore while breastfeeding. The following recommendations can help keep your breasts moisturized and healthy:  Avoid using soap on your nipples.  Wear a supportive bra designed especially for nursing. Avoid wearing underwire-style bras or extremely tight bras (sports bras).  Air-dry your nipples for 3-4 minutes after each feeding.  Use only cotton bra pads to absorb leaked breast milk. Leaking of breast milk between feedings is normal.  Use lanolin on your nipples after breastfeeding. Lanolin helps to maintain your skin's normal moisture barrier. Pure  lanolin is not harmful (not toxic) to your baby. You may also hand express a few drops of breast milk and gently  massage that milk into your nipples and allow the milk to air-dry. In the first few weeks after giving birth, some women experience breast engorgement. Engorgement can make your breasts feel heavy, warm, and tender to the touch. Engorgement peaks within 3-5 days after you give birth. The following recommendations can help to ease engorgement:  Completely empty your breasts while breastfeeding or pumping. You may want to start by applying warm, moist heat (in the shower or with warm, water-soaked hand towels) just before feeding or pumping. This increases circulation and helps the milk flow. If your baby does not completely empty your breasts while breastfeeding, pump any extra milk after he or she is finished.  Apply ice packs to your breasts immediately after breastfeeding or pumping, unless this is too uncomfortable for you. To do this: ? Put ice in a plastic bag. ? Place a towel between your skin and the bag. ? Leave the ice on for 20 minutes, 2-3 times a day.  Make sure that your baby is latched on and positioned properly while breastfeeding. If engorgement persists after 48 hours of following these recommendations, contact your health care provider or a Science writer. Overall health care recommendations while breastfeeding  Eat 3 healthy meals and 3 snacks every day. Well-nourished mothers who are breastfeeding need an additional 450-500 calories a day. You can meet this requirement by increasing the amount of a balanced diet that you eat.  Drink enough water to keep your urine pale yellow or clear.  Rest often, relax, and continue to take your prenatal vitamins to prevent fatigue, stress, and low vitamin and mineral levels in your body (nutrient deficiencies).  Do not use any products that contain nicotine or tobacco, such as cigarettes and e-cigarettes. Your baby may be  harmed by chemicals from cigarettes that pass into breast milk and exposure to secondhand smoke. If you need help quitting, ask your health care provider.  Avoid alcohol.  Do not use illegal drugs or marijuana.  Talk with your health care provider before taking any medicines. These include over-the-counter and prescription medicines as well as vitamins and herbal supplements. Some medicines that may be harmful to your baby can pass through breast milk.  It is possible to become pregnant while breastfeeding. If birth control is desired, ask your health care provider about options that will be safe while breastfeeding your baby. Where to find more information: [employer]: www.llli.org Contact a health care provider if:  You feel like you want to stop breastfeeding or have become frustrated with breastfeeding.  Your nipples are cracked or bleeding.  Your breasts are red, tender, or warm.  You have: ? Painful breasts or nipples. ? A swollen area on either breast. ? A fever or chills. ? Nausea or vomiting. ? Drainage other than breast milk from your nipples.  Your breasts do not become full before feedings by the fifth day after you give birth.  You feel sad and depressed.  Your baby is: ? Too sleepy to eat well. ? Having trouble sleeping. ? More than 39 week old and wetting fewer than 6 diapers in a 24-hour period. ? Not gaining weight by 81 days of age.  Your baby has fewer than 3 stools in a 24-hour period.  Your baby's skin or the white parts of his or her eyes become yellow. Get help right away if:  Your baby is overly tired (lethargic) and does not want to wake up and feed.  Your baby develops  an unexplained fever. Summary  Breastfeeding offers many health benefits for infant and mothers.  Try to breastfeed your infant when he or she shows early signs of hunger.  Gently tickle or stroke your baby's lips with your finger or nipple to allow the baby  to open his or her mouth. Bring the baby to your breast. Make sure that much of the areola is in your baby's mouth. Offer one side and burp the baby before you offer the other side.  Talk with your health care provider or lactation consultant if you have questions or you face problems as you breastfeed. This information is not intended to replace advice given to you by your health care provider. Make sure you discuss any questions you have with your health care provider. Document Released: 03/04/2005 Document Revised: 05/29/2017 Document Reviewed: 04/05/2016 Elsevier Patient Education  [place]. WHAT OB PATIENTS CAN EXPECT   Confirmation of pregnancy and ultrasound ordered if medically indicated-[redacted] weeks gestation  New OB (NOB) intake with nurse and New OB (NOB) labs- [redacted] weeks gestation  New OB (NOB) physical examination with provider- 11/[redacted] weeks gestation  Flu vaccine-[redacted] weeks gestation  Anatomy scan-[redacted] weeks gestation  Glucose tolerance test, blood work to test for anemia, T-dap vaccine-[redacted] weeks gestation  Vaginal swabs/cultures-STD/Group B strep-[redacted] weeks gestation  Appointments every 4 weeks until 28 weeks  Every 2 weeks from 28 weeks until 36 weeks  Weekly visits from 36 weeks until delivery  Round Ligament Pain  The round ligament is a cord of muscle and tissue that helps support the uterus. It can become a source of pain during pregnancy if it becomes stretched or twisted as the baby grows. The pain usually begins in the second trimester (13-28 weeks) of pregnancy, and it can come and go until the baby is delivered. It is not a serious problem, and it does not cause harm to the baby. Round ligament pain is usually a short, sharp, and pinching pain, but it can also be a dull, lingering, and aching pain. The pain is felt in the lower side of the abdomen or in the groin. It usually starts deep in the groin and moves up to the outside of the hip area. The pain may occur  when you:  Suddenly change position, such as quickly going from a sitting to standing position.  Roll over in bed.  Cough or sneeze.  Do physical activity. Follow these instructions at home:   Watch your condition for any changes.  When the pain starts, relax. Then try any of these methods to help with the pain: ? Sitting down. ? Flexing your knees up to your abdomen. ? Lying on your side with one pillow under your abdomen and another pillow between your legs. ? Sitting in a warm bath for 15-20 minutes or until the pain goes away.  Take over-the-counter and prescription medicines only as told by your health care provider.  Move slowly when you sit down or stand up.  Avoid long walks if they cause pain.  Stop or reduce your physical activities if they cause pain.  Keep all follow-up visits as told by your health care provider. This is important. Contact a health care provider if:  Your pain does not go away with treatment.  You feel pain in your back that you did not have before.  Your medicine is not helping. Get help right away if:  You have a fever or chills.  You develop uterine contractions.  You  have vaginal bleeding.  You have nausea or vomiting.  You have diarrhea.  You have pain when you urinate. Summary  Round ligament pain is felt in the lower abdomen or groin. It is usually a short, sharp, and pinching pain. It can also be a dull, lingering, and aching pain.  This pain usually begins in the second trimester (13-28 weeks). It occurs because the uterus is stretching with the growing baby, and it is not harmful to the baby.  You may notice the pain when you suddenly change position, when you cough or sneeze, or during physical activity.  Relaxing, flexing your knees to your abdomen, lying on one side, or taking a warm bath may help to get rid of the pain.  Get help from your health care provider if the pain does not go away or if you have vaginal  bleeding, nausea, vomiting, diarrhea, or painful urination. This information is not intended to replace advice given to you by your health care provider. Make sure you discuss any questions you have with your health care provider. Document Released: 12/12/2007 Document Revised: 08/20/2017 Document Reviewed: 08/20/2017 Elsevier Patient Education  [place]. Back Pain in Pregnancy Back pain during pregnancy is common. Back pain may be caused by several factors that are related to changes during your pregnancy. Follow these instructions at home: Managing pain, stiffness, and swelling      If directed, for sudden (acute) back pain, put ice on the painful area. ? Put ice in a plastic bag. ? Place a towel between your skin and the bag. ? Leave the ice on for 20 minutes, 2-3 times per day.  If directed, apply heat to the affected area before you exercise. Use the heat source that your health care provider recommends, such as a moist heat pack or a heating pad. ? Place a towel between your skin and the heat source. ? Leave the heat on for 20-30 minutes. ? Remove the heat if your skin turns bright red. This is especially important if you are unable to feel pain, heat, or cold. You may have a greater risk of getting burned.  If directed, massage the affected area. Activity  Exercise as told by your health care provider. Gentle exercise is the best way to prevent or manage back pain.  Listen to your body when lifting. If lifting hurts, ask for help or bend your knees. This uses your leg muscles instead of your back muscles.  Squat down when picking up something from the floor. Do not bend over.  Only use bed rest for short periods as told by your health care provider. Bed rest should only be used for the most severe episodes of back pain. Standing, sitting, and lying down  Do not stand in one place for long periods of time.  Use good posture when sitting. Make sure your head rests  over your shoulders and is not hanging forward. Use a pillow on your lower back if necessary.  Try sleeping on your side, preferably the left side, with a pregnancy support pillow or 1-2 regular pillows between your legs. ? If you have back pain after a night's rest, your bed may be too soft. ? A firm mattress may provide more support for your back during pregnancy. General instructions  Do not wear high heels.  Eat a healthy diet. Try to gain weight within your health care provider's recommendations.  Use a maternity girdle, elastic sling, or back brace as told by your  health care provider.  Take over-the-counter and prescription medicines only as told by your health care provider.  Work with a physical therapist or massage therapist to find ways to manage back pain. Acupuncture or massage therapy may be helpful.  Keep all follow-up visits as told by your health care provider. This is important. Contact a health care provider if:  Your back pain interferes with your daily activities.  You have increasing pain in other parts of your body. Get help right away if:  You develop numbness, tingling, weakness, or problems with the use of your arms or legs.  You develop severe back pain that is not controlled with medicine.  You have a change in bowel or bladder control.  You develop shortness of breath, dizziness, or you faint.  You develop nausea, vomiting, or sweating.  You have back pain that is a rhythmic, cramping pain similar to labor pains. Labor pain is usually 1-2 minutes apart, lasts for about 1 minute, and involves a bearing down feeling or pressure in your pelvis.  You have back pain and your water breaks or you have vaginal bleeding.  You have back pain or numbness that travels down your leg.  Your back pain developed after you fell.  You develop pain on one side of your back.  You see blood in your urine.  You develop skin blisters in the area of your back  pain. Summary  Back pain may be caused by several factors that are related to changes during your pregnancy.  Follow instructions as told by your health care provider for managing pain, stiffness, and swelling.  Exercise as told by your health care provider. Gentle exercise is the best way to prevent or manage back pain.  Take over-the-counter and prescription medicines only as told by your health care provider.  Keep all follow-up visits as told by your health care provider. This is important. This information is not intended to replace advice given to you by your health care provider. Make sure you discuss any questions you have with your health care provider. Document Released: 06/12/2005 Document Revised: 06/23/2018 Document Reviewed: 08/20/2017 Elsevier Patient Education  [place] of Pregnancy  The second trimester is from week 14 through week 27 (month 4 through 6). This is often the time in pregnancy that you feel your best. Often times, morning sickness has lessened or quit. You may have more energy, and you may get hungry more often. Your unborn baby is growing rapidly. At the end of the sixth month, he or she is about 9 inches long and weighs about 1 pounds. You will likely feel the baby move between 18 and 20 weeks of pregnancy. Follow these instructions at home: Medicines  Take over-the-counter and prescription medicines only as told by your doctor. Some medicines are safe and some medicines are not safe during pregnancy.  Take a prenatal vitamin that contains at least 600 micrograms (mcg) of folic acid.  If you have trouble pooping (constipation), take medicine that will make your stool soft (stool softener) if your doctor approves. Eating and drinking   Eat regular, healthy meals.  Avoid raw meat and uncooked cheese.  If you get low calcium from the food you eat, talk to your doctor about taking a daily calcium supplement.  Avoid foods  that are high in fat and sugars, such as fried and sweet foods.  If you feel sick to your stomach (nauseous) or throw up (vomit): ? Eat 4 or  5 small meals a day instead of 3 large meals. ? Try eating a few soda crackers. ? Drink liquids between meals instead of during meals.  To prevent constipation: ? Eat foods that are high in fiber, like fresh fruits and vegetables, whole grains, and beans. ? Drink enough fluids to keep your pee (urine) clear or pale yellow. Activity  Exercise only as told by your doctor. Stop exercising if you start to have cramps.  Do not exercise if it is too hot, too humid, or if you are in a place of great height (high altitude).  Avoid heavy lifting.  Wear low-heeled shoes. Sit and stand up straight.  You can continue to have sex unless your doctor tells you not to. Relieving pain and discomfort  Wear a good support bra if your breasts are tender.  Take warm water baths (sitz baths) to soothe pain or discomfort caused by hemorrhoids. Use hemorrhoid cream if your doctor approves.  Rest with your legs raised if you have leg cramps or low back pain.  If you develop puffy, bulging veins (varicose veins) in your legs: ? Wear support hose or compression stockings as told by your doctor. ? Raise (elevate) your feet for 15 minutes, 3-4 times a day. ? Limit salt in your food. Prenatal care  Write down your questions. Take them to your prenatal visits.  Keep all your prenatal visits as told by your doctor. This is important. Safety  Wear your seat belt when driving.  Make a list of emergency phone numbers, including numbers for family, friends, the hospital, and police and fire departments. General instructions  Ask your doctor about the right foods to eat or for help finding a counselor, if you need these services.  Ask your doctor about local prenatal classes. Begin classes before month 6 of your pregnancy.  Do not use hot tubs, steam rooms, or  saunas.  Do not douche or use tampons or scented sanitary pads.  Do not cross your legs for long periods of time.  Visit your dentist if you have not done so. Use a soft toothbrush to brush your teeth. Floss gently.  Avoid all smoking, herbs, and alcohol. Avoid drugs that are not approved by your doctor.  Do not use any products that contain nicotine or tobacco, such as cigarettes and e-cigarettes. If you need help quitting, ask your doctor.  Avoid cat litter boxes and soil used by cats. These carry germs that can cause birth defects in the baby and can cause a loss of your baby (miscarriage) or stillbirth. Contact a doctor if:  You have mild cramps or pressure in your lower belly.  You have pain when you pee (urinate).  You have bad smelling fluid coming from your vagina.  You continue to feel sick to your stomach (nauseous), throw up (vomit), or have watery poop (diarrhea).  You have a nagging pain in your belly area.  You feel dizzy. Get help right away if:  You have a fever.  You are leaking fluid from your vagina.  You have spotting or bleeding from your vagina.  You have severe belly cramping or pain.  You lose or gain weight rapidly.  You have trouble catching your breath and have chest pain.  You notice sudden or extreme puffiness (swelling) of your face, hands, ankles, feet, or legs.  You have not felt the baby move in over an hour.  You have severe headaches that do not go away when you take medicine.  You have trouble seeing. Summary  The second trimester is from week 14 through week 27 (months 4 through 6). This is often the time in pregnancy that you feel your best.  To take care of yourself and your unborn baby, you will need to eat healthy meals, take medicines only if your doctor tells you to do so, and do activities that are safe for you and your baby.  Call your doctor if you get sick or if you notice anything unusual about your pregnancy. Also,  call your doctor if you need help with the right food to eat, or if you want to know what activities are safe for you. This information is not intended to replace advice given to you by your health care provider. Make sure you discuss any questions you have with your health care provider. Document Released: 05/29/2009 Document Revised: 06/26/2018 Document Reviewed: 04/09/2016 Elsevier Patient Education  [place]. Common Medications Safe in Pregnancy  Acne:      Constipation:  Benzoyl Peroxide     Colace  Clindamycin      Dulcolax Suppository  Topica Erythromycin     Fibercon  Salicylic Acid      Metamucil         Miralax AVOID:        Senakot   Accutane    Cough:  Retin-A       Cough Drops  Tetracycline      Phenergan w/ Codeine if Rx  Minocycline      Robitussin (Plain & DM)  Antibiotics:     Crabs/Lice:  Ceclor       RID  Cephalosporins    AVOID:  E-Mycins      Kwell  Keflex  Macrobid/Macrodantin   Diarrhea:  Penicillin      Kao-Pectate  Zithromax      Imodium AD         PUSH FLUIDS AVOID:       Cipro     Fever:  Tetracycline      Tylenol (Regular or Extra  Minocycline       Strength)  Levaquin      Extra Strength-Do not          Exceed 8 tabs/24 hrs Caffeine:        <251m/day (equiv. To 1 cup of coffee or  approx. 3 12 oz sodas)         Gas: Cold/Hayfever:       Gas-X  Benadryl      Mylicon  Claritin       Phazyme  **Claritin-D        Chlor-Trimeton    Headaches:  Dimetapp      ASA-Free Excedrin  Drixoral-Non-Drowsy     Cold Compress  Mucinex (Guaifenasin)     Tylenol (Regular or Extra  Sudafed/Sudafed-12 Hour     Strength)  **Sudafed PE Pseudoephedrine   Tylenol Cold & Sinus     Vicks Vapor Rub  Zyrtec  **AVOID if Problems With Blood Pressure         Heartburn: Avoid lying down for at least 1 hour after meals  Aciphex      Maalox     Rash:  Milk of Magnesia     Benadryl    Mylanta       1% Hydrocortisone Cream  Pepcid  Pepcid  Complete   Sleep Aids:  Prevacid      Ambien   Prilosec       Benadryl  Rolaids       Chamomile Tea  Tums (Limit 4/day)     Unisom  Zantac       Tylenol PM         Warm milk-add vanilla or  Hemorrhoids:       Sugar for taste  Anusol/Anusol H.C.  (RX: Analapram 2.5%)  Sugar Substitutes:  Hydrocortisone OTC     Ok in moderation  Preparation H      Tucks        Vaseline lotion applied to tissue with wiping    Herpes:     Throat:  Acyclovir      Oragel  Famvir  Valtrex     Vaccines:         Flu Shot Leg Cramps:       *Gardasil  Benadryl      Hepatitis A         Hepatitis B Nasal Spray:       Pneumovax  Saline Nasal Spray     Polio Booster         Tetanus Nausea:       Tuberculosis test or PPD  Vitamin B6 25 mg TID   AVOID:    Dramamine      *Gardasil  Emetrol       Live Poliovirus  Ginger Root 250 mg QID    MMR (measles, mumps &  High Complex Carbs @ Bedtime    rebella)  Sea Bands-Accupressure    Varicella (Chickenpox)  Unisom 1/2 tab TID     *No known complications           If received before Pain:         Known pregnancy;   Darvocet       Resume series after  Lortab        Delivery  Percocet    Yeast:   Tramadol      Femstat  Tylenol 3      Gyne-lotrimin  Ultram       Monistat  Vicodin           MISC:         All Sunscreens           Hair Coloring/highlights          Insect Repellant's          (Including DEET)         Mystic Tans

## 2019-01-22 ENCOUNTER — Other Ambulatory Visit: Payer: Self-pay

## 2019-01-22 ENCOUNTER — Ambulatory Visit (INDEPENDENT_AMBULATORY_CARE_PROVIDER_SITE_OTHER): Payer: Medicaid Other | Admitting: Certified Nurse Midwife

## 2019-01-22 ENCOUNTER — Encounter: Payer: Medicaid Other | Admitting: Certified Nurse Midwife

## 2019-01-22 VITALS — BP 93/62 | HR 91 | Wt 166.5 lb

## 2019-01-22 DIAGNOSIS — Z3689 Encounter for other specified antenatal screening: Secondary | ICD-10-CM

## 2019-01-22 DIAGNOSIS — Z3492 Encounter for supervision of normal pregnancy, unspecified, second trimester: Secondary | ICD-10-CM

## 2019-01-22 NOTE — Progress Notes (Signed)
ROB-Doing well, headaches only when bending down. Encouraged adequate hydration. Anticipatory guidance regarding course of prenatal care. Reviewed red flag symptoms and when to call. RTC x 4 weeks for ANATOMY SCAN and ROB or sooner if needed.

## 2019-01-22 NOTE — Patient Instructions (Signed)
WHAT OB PATIENTS CAN EXPECT   Confirmation of pregnancy and ultrasound ordered if medically indicated-[redacted] weeks gestation  New OB (NOB) intake with nurse and New OB (NOB) labs- [redacted] weeks gestation  New OB (NOB) physical examination with provider- 11/[redacted] weeks gestation  Flu vaccine-[redacted] weeks gestation  Anatomy scan-[redacted] weeks gestation  Glucose tolerance test, blood work to test for anemia, T-dap vaccine-[redacted] weeks gestation  Vaginal swabs/cultures-STD/Group B strep-[redacted] weeks gestation  Appointments every 4 weeks until 28 weeks  Every 2 weeks from 28 weeks until 36 weeks  Weekly visits from 36 weeks until delivery  Round Ligament Pain  The round ligament is a cord of muscle and tissue that helps support the uterus. It can become a source of pain during pregnancy if it becomes stretched or twisted as the baby grows. The pain usually begins in the second trimester (13-28 weeks) of pregnancy, and it can come and go until the baby is delivered. It is not a serious problem, and it does not cause harm to the baby. Round ligament pain is usually a short, sharp, and pinching pain, but it can also be a dull, lingering, and aching pain. The pain is felt in the lower side of the abdomen or in the groin. It usually starts deep in the groin and moves up to the outside of the hip area. The pain may occur when you:  Suddenly change position, such as quickly going from a sitting to standing position.  Roll over in bed.  Cough or sneeze.  Do physical activity. Follow these instructions at home:   Watch your condition for any changes.  When the pain starts, relax. Then try any of these methods to help with the pain: ? Sitting down. ? Flexing your knees up to your abdomen. ? Lying on your side with one pillow under your abdomen and another pillow between your legs. ? Sitting in a warm bath for 15-20 minutes or until the pain goes away.  Take over-the-counter and prescription medicines only as told by  your health care provider.  Move slowly when you sit down or stand up.  Avoid long walks if they cause pain.  Stop or reduce your physical activities if they cause pain.  Keep all follow-up visits as told by your health care provider. This is important. Contact a health care provider if:  Your pain does not go away with treatment.  You feel pain in your back that you did not have before.  Your medicine is not helping. Get help right away if:  You have a fever or chills.  You develop uterine contractions.  You have vaginal bleeding.  You have nausea or vomiting.  You have diarrhea.  You have pain when you urinate. Summary  Round ligament pain is felt in the lower abdomen or groin. It is usually a short, sharp, and pinching pain. It can also be a dull, lingering, and aching pain.  This pain usually begins in the second trimester (13-28 weeks). It occurs because the uterus is stretching with the growing baby, and it is not harmful to the baby.  You may notice the pain when you suddenly change position, when you cough or sneeze, or during physical activity.  Relaxing, flexing your knees to your abdomen, lying on one side, or taking a warm bath may help to get rid of the pain.  Get help from your health care provider if the pain does not go away or if you have vaginal bleeding, nausea, vomiting,  diarrhea, or painful urination. This information is not intended to replace advice given to you by your health care provider. Make sure you discuss any questions you have with your health care provider. Document Released: 12/12/2007 Document Revised: 08/20/2017 Document Reviewed: 08/20/2017 Elsevier Patient Education  [place]. Back Pain in Pregnancy Back pain during pregnancy is common. Back pain may be caused by several factors that are related to changes during your pregnancy. Follow these instructions at home: Managing pain, stiffness, and swelling      If directed,  for sudden (acute) back pain, put ice on the painful area. ? Put ice in a plastic bag. ? Place a towel between your skin and the bag. ? Leave the ice on for 20 minutes, 2-3 times per day.  If directed, apply heat to the affected area before you exercise. Use the heat source that your health care provider recommends, such as a moist heat pack or a heating pad. ? Place a towel between your skin and the heat source. ? Leave the heat on for 20-30 minutes. ? Remove the heat if your skin turns bright red. This is especially important if you are unable to feel pain, heat, or cold. You may have a greater risk of getting burned.  If directed, massage the affected area. Activity  Exercise as told by your health care provider. Gentle exercise is the best way to prevent or manage back pain.  Listen to your body when lifting. If lifting hurts, ask for help or bend your knees. This uses your leg muscles instead of your back muscles.  Squat down when picking up something from the floor. Do not bend over.  Only use bed rest for short periods as told by your health care provider. Bed rest should only be used for the most severe episodes of back pain. Standing, sitting, and lying down  Do not stand in one place for long periods of time.  Use good posture when sitting. Make sure your head rests over your shoulders and is not hanging forward. Use a pillow on your lower back if necessary.  Try sleeping on your side, preferably the left side, with a pregnancy support pillow or 1-2 regular pillows between your legs. ? If you have back pain after a night's rest, your bed may be too soft. ? A firm mattress may provide more support for your back during pregnancy. General instructions  Do not wear high heels.  Eat a healthy diet. Try to gain weight within your health care provider's recommendations.  Use a maternity girdle, elastic sling, or back brace as told by your health care provider.  Take  over-the-counter and prescription medicines only as told by your health care provider.  Work with a physical therapist or massage therapist to find ways to manage back pain. Acupuncture or massage therapy may be helpful.  Keep all follow-up visits as told by your health care provider. This is important. Contact a health care provider if:  Your back pain interferes with your daily activities.  You have increasing pain in other parts of your body. Get help right away if:  You develop numbness, tingling, weakness, or problems with the use of your arms or legs.  You develop severe back pain that is not controlled with medicine.  You have a change in bowel or bladder control.  You develop shortness of breath, dizziness, or you faint.  You develop nausea, vomiting, or sweating.  You have back pain that is a rhythmic,  cramping pain similar to labor pains. Labor pain is usually 1-2 minutes apart, lasts for about 1 minute, and involves a bearing down feeling or pressure in your pelvis.  You have back pain and your water breaks or you have vaginal bleeding.  You have back pain or numbness that travels down your leg.  Your back pain developed after you fell.  You develop pain on one side of your back.  You see blood in your urine.  You develop skin blisters in the area of your back pain. Summary  Back pain may be caused by several factors that are related to changes during your pregnancy.  Follow instructions as told by your health care provider for managing pain, stiffness, and swelling.  Exercise as told by your health care provider. Gentle exercise is the best way to prevent or manage back pain.  Take over-the-counter and prescription medicines only as told by your health care provider.  Keep all follow-up visits as told by your health care provider. This is important. This information is not intended to replace advice given to you by your health care provider. Make sure you  discuss any questions you have with your health care provider. Document Released: 06/12/2005 Document Revised: 06/23/2018 Document Reviewed: 08/20/2017 Elsevier Patient Education  2020 [employer].

## 2019-02-25 ENCOUNTER — Ambulatory Visit (INDEPENDENT_AMBULATORY_CARE_PROVIDER_SITE_OTHER): Payer: Medicaid Other | Admitting: Obstetrics and Gynecology

## 2019-02-25 ENCOUNTER — Encounter: Payer: Self-pay | Admitting: Obstetrics and Gynecology

## 2019-02-25 ENCOUNTER — Ambulatory Visit (INDEPENDENT_AMBULATORY_CARE_PROVIDER_SITE_OTHER): Payer: Medicaid Other

## 2019-02-25 ENCOUNTER — Other Ambulatory Visit: Payer: Self-pay

## 2019-02-25 VITALS — BP 112/72 | HR 109 | Wt 168.9 lb

## 2019-02-25 DIAGNOSIS — Z363 Encounter for antenatal screening for malformations: Secondary | ICD-10-CM

## 2019-02-25 DIAGNOSIS — Z3A21 21 weeks gestation of pregnancy: Secondary | ICD-10-CM

## 2019-02-25 DIAGNOSIS — Z3492 Encounter for supervision of normal pregnancy, unspecified, second trimester: Secondary | ICD-10-CM

## 2019-02-25 DIAGNOSIS — Z3689 Encounter for other specified antenatal screening: Secondary | ICD-10-CM

## 2019-02-25 LAB — POCT URINALYSIS DIPSTICK OB
Bilirubin, UA: NEGATIVE
Blood, UA: NEGATIVE
Glucose, UA: NEGATIVE
Leukocytes, UA: NEGATIVE
Nitrite, UA: NEGATIVE
POC,PROTEIN,UA: NEGATIVE
Spec Grav, UA: >=1.03 — AB (ref 1.010–1.025)
Urobilinogen, UA: 0.2 E.U./dL
pH, UA: 6 (ref 5.0–8.0)

## 2019-02-25 NOTE — Progress Notes (Signed)
ROB: Doing well, no complaints.  For anatomy scan today.  Discussed breastfeeding, Ready Set Baby.  Breastfeed last baby for 3 months, would like to go longer this time.  Recommended at least 6 months of exclusive breastfeeding. Mild ketonuria noted today. Discussed adequate hydration and protein. Patient notes she is eating very well and trying to stay hydrated. RTC in 4 weeks.    The following were addressed during this visit:  Breastfeeding Education - Early initiation of breastfeeding    Comments: Keeps milk supply adequate, helps contract uterus and slow bleeding, and early milk is the perfect first food and is easy to digest.   - Nonpharmacological pain relief methods for labor    Comments: Deep breathing, focusing on pleasant things, movement and walking, heating pads or cold compress, massage and relaxation, continuous support from someone you trust, and Doulas   - The importance of early skin-to-skin contact    Comments: Keeps baby warm and secure, helps keep baby's blood sugar up and breathing steady, easier to bond and breastfeed, and helps calm baby.  - Feeding on demand or baby-led feeding    Comments: Helps prevent breastfeeding complications, helps bring in good milk supply, prevents under or overfeeding, and helps baby feel content and satisfied   - Effective positioning and attachment    Comments: Helps my baby to get enough breast milk, helps to produce an adequate milk supply, and helps prevent nipple pain and damage   - Exclusive breastfeeding for the first 6 months    Comments: Builds a healthy milk supply and keeps it up, protects baby from sickness and disease, and breastmilk has everything your baby needs for the first 6 months.

## 2019-02-25 NOTE — Patient Instructions (Addendum)
Second Trimester of Pregnancy The second trimester is from week 14 through week 27 (months 4 through 6). The second trimester is often a time when you feel your best. Your body has adjusted to being pregnant, and you begin to feel better physically. Usually, morning sickness has lessened or quit completely, you may have more energy, and you may have an increase in appetite. The second trimester is also a time when the fetus is growing rapidly. At the end of the sixth month, the fetus is about 9 inches long and weighs about 1 pounds. You will likely begin to feel the baby move (quickening) between 16 and 20 weeks of pregnancy. Body changes during your second trimester Your body continues to go through many changes during your second trimester. The changes vary from woman to woman.  Your weight will continue to increase. You will notice your lower abdomen bulging out.  You may begin to get stretch marks on your hips, abdomen, and breasts.  You may develop headaches that can be relieved by medicines. The medicines should be approved by your health care provider.  You may urinate more often because the fetus is pressing on your bladder.  You may develop or continue to have heartburn as a result of your pregnancy.  You may develop constipation because certain hormones are causing the muscles that push waste through your intestines to slow down.  You may develop hemorrhoids or swollen, bulging veins (varicose veins).  You may have back pain. This is caused by: ? Weight gain. ? Pregnancy hormones that are relaxing the joints in your pelvis. ? A shift in weight and the muscles that support your balance.  Your breasts will continue to grow and they will continue to become tender.  Your gums may bleed and may be sensitive to brushing and flossing.  Dark spots or blotches (chloasma, mask of pregnancy) may develop on your face. This will likely fade after the baby is born.  A dark line from your  belly button to the pubic area (linea nigra) may appear. This will likely fade after the baby is born.  You may have changes in your hair. These can include thickening of your hair, rapid growth, and changes in texture. Some women also have hair loss during or after pregnancy, or hair that feels dry or thin. Your hair will most likely return to normal after your baby is born. What to expect at prenatal visits During a routine prenatal visit:  You will be weighed to make sure you and the fetus are growing normally.  Your blood pressure will be taken.  Your abdomen will be measured to track your baby's growth.  The fetal heartbeat will be listened to.  Any test results from the previous visit will be discussed. Your health care provider may ask you:  How you are feeling.  If you are feeling the baby move.  If you have had any abnormal symptoms, such as leaking fluid, bleeding, severe headaches, or abdominal cramping.  If you are using any tobacco products, including cigarettes, chewing tobacco, and electronic cigarettes.  If you have any questions. Other tests that may be performed during your second trimester include:  Blood tests that check for: ? Low iron levels (anemia). ? High blood sugar that affects pregnant women (gestational diabetes) between 24 and 28 weeks. ? Rh antibodies. This is to check for a protein on red blood cells (Rh factor).  Urine tests to check for infections, diabetes, or protein in the   urine.  An ultrasound to confirm the proper growth and development of the baby.  An amniocentesis to check for possible genetic problems.  Fetal screens for spina bifida and Down syndrome.  HIV (human immunodeficiency virus) testing. Routine prenatal testing includes screening for HIV, unless you choose not to have this test. Follow these instructions at home: Medicines  Follow your health care provider's instructions regarding medicine use. Specific medicines may be  either safe or unsafe to take during pregnancy.  Take a prenatal vitamin that contains at least 600 micrograms (mcg) of folic acid.  If you develop constipation, try taking a stool softener if your health care provider approves. Eating and drinking   Eat a balanced diet that includes fresh fruits and vegetables, whole grains, good sources of protein such as meat, eggs, or tofu, and low-fat dairy. Your health care provider will help you determine the amount of weight gain that is right for you.  Avoid raw meat and uncooked cheese. These carry germs that can cause birth defects in the baby.  If you have low calcium intake from food, talk to your health care provider about whether you should take a daily calcium supplement.  Limit foods that are high in fat and processed sugars, such as fried and sweet foods.  To prevent constipation: ? Drink enough fluid to keep your urine clear or pale yellow. ? Eat foods that are high in fiber, such as fresh fruits and vegetables, whole grains, and beans. Activity  Exercise only as directed by your health care provider. Most women can continue their usual exercise routine during pregnancy. Try to exercise for 30 minutes at least 5 days a week. Stop exercising if you experience uterine contractions.  Avoid heavy lifting, wear low heel shoes, and practice good posture.  A sexual relationship may be continued unless your health care provider directs you otherwise. Relieving pain and discomfort  Wear a good support bra to prevent discomfort from breast tenderness.  Take warm sitz baths to soothe any pain or discomfort caused by hemorrhoids. Use hemorrhoid cream if your health care provider approves.  Rest with your legs elevated if you have leg cramps or low back pain.  If you develop varicose veins, wear support hose. Elevate your feet for 15 minutes, 3-4 times a day. Limit salt in your diet. Prenatal Care  Write down your questions. Take them to  your prenatal visits.  Keep all your prenatal visits as told by your health care provider. This is important. Safety  Wear your seat belt at all times when driving.  Make a list of emergency phone numbers, including numbers for family, friends, the hospital, and police and fire departments. General instructions  Ask your health care provider for a referral to a local prenatal education class. Begin classes no later than the beginning of month 6 of your pregnancy.  Ask for help if you have counseling or nutritional needs during pregnancy. Your health care provider can offer advice or refer you to specialists for help with various needs.  Do not use hot tubs, steam rooms, or saunas.  Do not douche or use tampons or scented sanitary pads.  Do not cross your legs for long periods of time.  Avoid cat litter boxes and soil used by cats. These carry germs that can cause birth defects in the baby and possibly loss of the fetus by miscarriage or stillbirth.  Avoid all smoking, herbs, alcohol, and unprescribed drugs. Chemicals in these products can affect the formation   and growth of the baby. °· Do not use any products that contain nicotine or tobacco, such as cigarettes and e-cigarettes. If you need help quitting, ask your health care provider. °· Visit your dentist if you have not gone yet during your pregnancy. Use a soft toothbrush to brush your teeth and be gentle when you floss. °Contact a health care provider if: °· You have dizziness. °· You have mild pelvic cramps, pelvic pressure, or nagging pain in the abdominal area. °· You have persistent nausea, vomiting, or diarrhea. °· You have a bad smelling vaginal discharge. °· You have pain when you urinate. °Get help right away if: °· You have a fever. °· You are leaking fluid from your vagina. °· You have spotting or bleeding from your vagina. °· You have severe abdominal cramping or pain. °· You have rapid weight gain or weight loss. °· You have  shortness of breath with chest pain. °· You notice sudden or extreme swelling of your face, hands, ankles, feet, or legs. °· You have not felt your baby move in over an hour. °· You have severe headaches that do not go away when you take medicine. °· You have vision changes. °Summary °· The second trimester is from week 14 through week 27 (months 4 through 6). It is also a time when the fetus is growing rapidly. °· Your body goes through many changes during pregnancy. The changes vary from woman to woman. °· Avoid all smoking, herbs, alcohol, and unprescribed drugs. These chemicals affect the formation and growth your baby. °· Do not use any tobacco products, such as cigarettes, chewing tobacco, and e-cigarettes. If you need help quitting, ask your health care provider. °· Contact your health care provider if you have any questions. Keep all prenatal visits as told by your health care provider. This is important. °This information is not intended to replace advice given to you by your health care provider. Make sure you discuss any questions you have with your health care provider. °Document Released: 02/26/2001 Document Revised: 06/26/2018 Document Reviewed: 04/09/2016 °Elsevier Patient Education © 2020 [employer] ° ° ° °Breastfeeding ° °Choosing to breastfeed is one of the best decisions you can make for yourself and your baby. A change in hormones during pregnancy causes your breasts to make breast milk in your milk-producing glands. Hormones prevent breast milk from being released before your baby is born. They also prompt milk flow after birth. Once breastfeeding has begun, thoughts of your baby, as well as his or her sucking or crying, can stimulate the release of milk from your milk-producing glands. °Benefits of breastfeeding °Research shows that breastfeeding offers many health benefits for infants and mothers. It also offers a cost-free and convenient way to feed your baby. °For your baby °· Your first  milk (colostrum) helps your baby's digestive system to function better. °· Special cells in your milk (antibodies) help your baby to fight off infections. °· Breastfed babies are less likely to develop asthma, allergies, obesity, or type 2 diabetes. They are also at lower risk for sudden infant death syndrome (SIDS). °· Nutrients in breast milk are better able to meet your baby’s needs compared to infant formula. °· Breast milk improves your baby's brain development. °For you °· Breastfeeding helps to create a very special bond between you and your baby. °· Breastfeeding is convenient. Breast milk costs nothing and is always available at the correct temperature. °· Breastfeeding helps to burn calories. It helps you to lose the   weight that you gained during pregnancy. °· Breastfeeding makes your uterus return faster to its size before pregnancy. It also slows bleeding (lochia) after you give birth. °· Breastfeeding helps to lower your risk of developing type 2 diabetes, osteoporosis, rheumatoid arthritis, cardiovascular disease, and breast, ovarian, uterine, and endometrial cancer later in life. °Breastfeeding basics °Starting breastfeeding °· Find a comfortable place to sit or lie down, with your neck and back well-supported. °· Place a pillow or a rolled-up blanket under your baby to bring him or her to the level of your breast (if you are seated). Nursing pillows are specially designed to help support your arms and your baby while you breastfeed. °· Make sure that your baby's tummy (abdomen) is facing your abdomen. °· Gently massage your breast. With your fingertips, massage from the outer edges of your breast inward toward the nipple. This encourages milk flow. If your milk flows slowly, you may need to continue this action during the feeding. °· Support your breast with 4 fingers underneath and your thumb above your nipple (make the letter "C" with your hand). Make sure your fingers are well away from your  nipple and your baby’s mouth. °· Stroke your baby's lips gently with your finger or nipple. °· When your baby's mouth is open wide enough, quickly bring your baby to your breast, placing your entire nipple and as much of the areola as possible into your baby's mouth. The areola is the colored area around your nipple. °? More areola should be visible above your baby's upper lip than below the lower lip. °? Your baby's lips should be opened and extended outward (flanged) to ensure an adequate, comfortable latch. °? Your baby's tongue should be between his or her lower gum and your breast. °· Make sure that your baby's mouth is correctly positioned around your nipple (latched). Your baby's lips should create a seal on your breast and be turned out (everted). °· It is common for your baby to suck about 2-3 minutes in order to start the flow of breast milk. °Latching °Teaching your baby how to latch onto your breast properly is very important. An improper latch can cause nipple pain, decreased milk supply, and poor weight gain in your baby. Also, if your baby is not latched onto your nipple properly, he or she may swallow some air during feeding. This can make your baby fussy. Burping your baby when you switch breasts during the feeding can help to get rid of the air. However, teaching your baby to latch on properly is still the best way to prevent fussiness from swallowing air while breastfeeding. °Signs that your baby has successfully latched onto your nipple °· Silent tugging or silent sucking, without causing you pain. Infant's lips should be extended outward (flanged). °· Swallowing heard between every 3-4 sucks once your milk has started to flow (after your let-down milk reflex occurs). °· Muscle movement above and in front of his or her ears while sucking. °Signs that your baby has not successfully latched onto your nipple °· Sucking sounds or smacking sounds from your baby while breastfeeding. °· Nipple pain. °If  you think your baby has not latched on correctly, slip your finger into the corner of your baby’s mouth to break the suction and place it between your baby's gums. Attempt to start breastfeeding again. °Signs of successful breastfeeding °Signs from your baby °· Your baby will gradually decrease the number of sucks or will completely stop sucking. °· Your baby will fall   fall asleep.  Your baby's body will relax.  Your baby will retain a small amount of milk in his or her mouth.  Your baby will let go of your breast by himself or herself. Signs from you  Breasts that have increased in firmness, weight, and size 1-3 hours after feeding.  Breasts that are softer immediately after breastfeeding.  Increased milk volume, as well as a change in milk consistency and color by the fifth day of breastfeeding.  Nipples that are not sore, cracked, or bleeding. Signs that your baby is getting enough milk  Wetting at least 1-2 diapers during the first 24 hours after birth.  Wetting at least 5-6 diapers every 24 hours for the first week after birth. The urine should be clear or pale yellow by the age of 5 days.  Wetting 6-8 diapers every 24 hours as your baby continues to grow and develop.  At least 3 stools in a 24-hour period by the age of 5 days. The stool should be soft and yellow.  At least 3 stools in a 24-hour period by the age of 7 days. The stool should be seedy and yellow.  No loss of weight greater than 10% of birth weight during the first 3 days of life.  Average weight gain of 4-7 oz (113-198 g) per week after the age of 4 days.  Consistent daily weight gain by the age of 5 days, without weight loss after the age of 2 weeks. After a feeding, your baby may spit up a small amount of milk. This is normal. Breastfeeding frequency and duration Frequent feeding will help you make more milk and can prevent sore nipples and extremely full breasts (breast engorgement). Breastfeed when you feel the  need to reduce the fullness of your breasts or when your baby shows signs of hunger. This is called "breastfeeding on demand." Signs that your baby is hungry include:  Increased alertness, activity, or restlessness.  Movement of the head from side to side.  Opening of the mouth when the corner of the mouth or cheek is stroked (rooting).  Increased sucking sounds, smacking lips, cooing, sighing, or squeaking.  Hand-to-mouth movements and sucking on fingers or hands.  Fussing or crying. Avoid introducing a pacifier to your baby in the first 4-6 weeks after your baby is born. After this time, you may choose to use a pacifier. Research has shown that pacifier use during the first year of a baby's life decreases the risk of sudden infant death syndrome (SIDS). Allow your baby to feed on each breast as long as he or she wants. When your baby unlatches or falls asleep while feeding from the first breast, offer the second breast. Because newborns are often sleepy in the first few weeks of life, you may need to awaken your baby to get him or her to feed. Breastfeeding times will vary from baby to baby. However, the following rules can serve as a guide to help you make sure that your baby is properly fed:  Newborns (babies 734 weeks of age or younger) may breastfeed every 1-3 hours.  Newborns should not go without breastfeeding for longer than 3 hours during the day or 5 hours during the night.  You should breastfeed your baby a minimum of 8 times in a 24-hour period. Breast milk pumping     Pumping and storing breast milk allows you to make sure that your baby is exclusively fed your breast milk, even at times when you are  to breastfeed. This is especially important if you go back to work while you are still breastfeeding, or if you are not able to be present during feedings. Your lactation consultant can help you find a method of pumping that works best for you and give you guidelines about  how long it is safe to store breast milk. °Caring for your breasts while you breastfeed °Nipples can become dry, cracked, and sore while breastfeeding. The following recommendations can help keep your breasts moisturized and healthy: °· Avoid using soap on your nipples. °· Wear a supportive bra designed especially for nursing. Avoid wearing underwire-style bras or extremely tight bras (sports bras). °· Air-dry your nipples for 3-4 minutes after each feeding. °· Use only cotton bra pads to absorb leaked breast milk. Leaking of breast milk between feedings is normal. °· Use lanolin on your nipples after breastfeeding. Lanolin helps to maintain your skin's normal moisture barrier. Pure lanolin is not harmful (not toxic) to your baby. You may also hand express a few drops of breast milk and gently massage that milk into your nipples and allow the milk to air-dry. °In the first few weeks after giving birth, some women experience breast engorgement. Engorgement can make your breasts feel heavy, warm, and tender to the touch. Engorgement peaks within 3-5 days after you give birth. The following recommendations can help to ease engorgement: °· Completely empty your breasts while breastfeeding or pumping. You may want to start by applying warm, moist heat (in the shower or with warm, water-soaked hand towels) just before feeding or pumping. This increases circulation and helps the milk flow. If your baby does not completely empty your breasts while breastfeeding, pump any extra milk after he or she is finished. °· Apply ice packs to your breasts immediately after breastfeeding or pumping, unless this is too uncomfortable for you. To do this: °? Put ice in a plastic bag. °? Place a towel between your skin and the bag. °? Leave the ice on for 20 minutes, 2-3 times a day. °· Make sure that your baby is latched on and positioned properly while breastfeeding. °If engorgement persists after 48 hours of following these  recommendations, contact your health care provider or a lactation consultant. °Overall health care recommendations while breastfeeding °· Eat 3 healthy meals and 3 snacks every day. Well-nourished mothers who are breastfeeding need an additional 450-500 calories a day. You can meet this requirement by increasing the amount of a balanced diet that you eat. °· Drink enough water to keep your urine pale yellow or clear. °· Rest often, relax, and continue to take your prenatal vitamins to prevent fatigue, stress, and low vitamin and mineral levels in your body (nutrient deficiencies). °· Do not use any products that contain nicotine or tobacco, such as cigarettes and e-cigarettes. Your baby may be harmed by chemicals from cigarettes that pass into breast milk and exposure to secondhand smoke. If you need help quitting, ask your health care provider. °· Avoid alcohol. °· Do not use illegal drugs or marijuana. °· Talk with your health care provider before taking any medicines. These include over-the-counter and prescription medicines as well as vitamins and herbal supplements. Some medicines that may be harmful to your baby can pass through breast milk. °· It is possible to become pregnant while breastfeeding. If birth control is desired, ask your health care provider about options that will be safe while breastfeeding your baby. °Where to find more information: °[employer]: www.llli.org °Contact a   a health care provider if:  You feel like you want to stop breastfeeding or have become frustrated with breastfeeding.  Your nipples are cracked or bleeding.  Your breasts are red, tender, or warm.  You have: ? Painful breasts or nipples. ? A swollen area on either breast. ? A fever or chills. ? Nausea or vomiting. ? Drainage other than breast milk from your nipples.  Your breasts do not become full before feedings by the fifth day after you give birth.  You feel sad and depressed.  Your baby  is: ? Too sleepy to eat well. ? Having trouble sleeping. ? More than 74 week old and wetting fewer than 6 diapers in a 24-hour period. ? Not gaining weight by 58 days of age.  Your baby has fewer than 3 stools in a 24-hour period.  Your baby's skin or the white parts of his or her eyes become yellow. Get help right away if:  Your baby is overly tired (lethargic) and does not want to wake up and feed.  Your baby develops an unexplained fever. Summary  Breastfeeding offers many health benefits for infant and mothers.  Try to breastfeed your infant when he or she shows early signs of hunger.  Gently tickle or stroke your baby's lips with your finger or nipple to allow the baby to open his or her mouth. Bring the baby to your breast. Make sure that much of the areola is in your baby's mouth. Offer one side and burp the baby before you offer the other side.  Talk with your health care provider or lactation consultant if you have questions or you face problems as you breastfeed. This information is not intended to replace advice given to you by your health care provider. Make sure you discuss any questions you have with your health care provider. Document Released: 03/04/2005 Document Revised: 05/29/2017 Document Reviewed: 04/05/2016 Elsevier Patient Education  2020 [employer].

## 2019-02-25 NOTE — Progress Notes (Signed)
ROB-Pt present for routine prenatal care. Pt stated that she was doing well no problems. Pt declined flu vaccine.

## 2019-03-19 NOTE — L&D Delivery Note (Signed)
       Delivery Note   Miranda Prince is a 19 y.o. G2P1001 at [redacted]w[redacted]d Estimated Date of Delivery: 07/07/19  PRE-OPERATIVE DIAGNOSIS:  1) [redacted]w[redacted]d pregnancy.   POST-OPERATIVE DIAGNOSIS:  1) [redacted]w[redacted]d pregnancy s/p Vaginal, Spontaneous   Delivery Type: Vaginal, Spontaneous    Delivery Anesthesia: Epidural   Labor Complications:  nuchal cord x 1 , reduced    ESTIMATED BLOOD LOSS: 200  ml    FINDINGS:   1) female infant, Apgar scores of 7   at 1 minute and 9   at 5 minutes and a birthweight of   ounces.    2) Nuchal cord: yes  SPECIMENS:   PLACENTA:   Appearance: Intact , 3 vessel cord, cord blood collected   Removal: Spontaneous      Disposition:  held per protocol   DISPOSITION:  Infant to left in stable condition in the delivery room, with L&D personnel and mother,  NARRATIVE SUMMARY: Labor course:  Miranda Prince is a G2P1001 at [redacted]w[redacted]d who presented for induction of labor.  She progressed well in labor with pitocin.  She received the appropriate anesthesia and proceeded to complete dilation. She evidenced good maternal expulsive effort during the second stage. She went on to deliver a viable infant. The placenta delivered without problems and was noted to be complete. A perineal and vaginal examination was performed. Episiotomy/Lacerations:  none, periurethral skid mark, hemostatic The patient tolerated this well.  Doreene Burke, CNM  07/12/2019 1:18 PM

## 2019-03-26 ENCOUNTER — Encounter: Payer: Medicaid Other | Admitting: Certified Nurse Midwife

## 2019-03-29 ENCOUNTER — Telehealth: Payer: Self-pay

## 2019-03-29 ENCOUNTER — Telehealth: Payer: Self-pay | Admitting: Certified Nurse Midwife

## 2019-03-29 NOTE — Telephone Encounter (Signed)
mychart message sent to patient- go to Urgent Care or ED

## 2019-03-29 NOTE — Telephone Encounter (Signed)
Pt called in requesting a call back from a nurse . Pt is having a hard time catching her breath

## 2019-03-31 ENCOUNTER — Other Ambulatory Visit: Payer: Self-pay

## 2019-03-31 ENCOUNTER — Encounter: Payer: Self-pay | Admitting: Certified Nurse Midwife

## 2019-03-31 ENCOUNTER — Ambulatory Visit (INDEPENDENT_AMBULATORY_CARE_PROVIDER_SITE_OTHER): Payer: Medicaid Other | Admitting: Certified Nurse Midwife

## 2019-03-31 VITALS — BP 95/52 | HR 98 | Wt 176.2 lb

## 2019-03-31 DIAGNOSIS — Z3492 Encounter for supervision of normal pregnancy, unspecified, second trimester: Secondary | ICD-10-CM

## 2019-03-31 NOTE — Patient Instructions (Signed)
Glucose Tolerance Test During Pregnancy Why am I having this test? The glucose tolerance test (GTT) is done to check how your body processes sugar (glucose). This is one of several tests used to diagnose diabetes that develops during pregnancy (gestational diabetes mellitus). Gestational diabetes is a temporary form of diabetes that some women develop during pregnancy. It usually occurs during the second trimester of pregnancy and goes away after delivery. Testing (screening) for gestational diabetes usually occurs between 24 and 28 weeks of pregnancy. You may have the GTT test after having a 1-hour glucose screening test if the results from that test indicate that you may have gestational diabetes. You may also have this test if:  You have a history of gestational diabetes.  You have a history of giving birth to very large babies or have experienced repeated fetal loss (stillbirth).  You have signs and symptoms of diabetes, such as: ? Changes in your vision. ? Tingling or numbness in your hands or feet. ? Changes in hunger, thirst, and urination that are not otherwise explained by your pregnancy. What is being tested? This test measures the amount of glucose in your blood at different times during a period of 3 hours. This indicates how well your body is able to process glucose. What kind of sample is taken?  Blood samples are required for this test. They are usually collected by inserting a needle into a blood vessel. How do I prepare for this test?  For 3 days before your test, eat normally. Have plenty of carbohydrate-rich foods.  Follow instructions from your health care provider about: ? Eating or drinking restrictions on the day of the test. You may be asked to not eat or drink anything other than water (fast) starting 8-10 hours before the test. ? Changing or stopping your regular medicines. Some medicines may interfere with this test. Tell a health care provider about:  All  medicines you are taking, including vitamins, herbs, eye drops, creams, and over-the-counter medicines.  Any blood disorders you have.  Any surgeries you have had.  Any medical conditions you have. What happens during the test? First, your blood glucose will be measured. This is referred to as your fasting blood glucose, since you fasted before the test. Then, you will drink a glucose solution that contains a certain amount of glucose. Your blood glucose will be measured again 1, 2, and 3 hours after drinking the solution. This test takes about 3 hours to complete. You will need to stay at the testing location during this time. During the testing period:  Do not eat or drink anything other than the glucose solution.  Do not exercise.  Do not use any products that contain nicotine or tobacco, such as cigarettes and e-cigarettes. If you need help stopping, ask your health care provider. The testing procedure may vary among health care providers and hospitals. How are the results reported? Your results will be reported as milligrams of glucose per deciliter of blood (mg/dL) or millimoles per liter (mmol/L). Your health care provider will compare your results to normal ranges that were established after testing a large group of people (reference ranges). Reference ranges may vary among labs and hospitals. For this test, common reference ranges are:  Fasting: less than 95-105 mg/dL (5.3-5.8 mmol/L).  1 hour after drinking glucose: less than 180-190 mg/dL (10.0-10.5 mmol/L).  2 hours after drinking glucose: less than 155-165 mg/dL (8.6-9.2 mmol/L).  3 hours after drinking glucose: 140-145 mg/dL (7.8-8.1 mmol/L). What do the   results mean? Results within reference ranges are considered normal, meaning that your glucose levels are well-controlled. If two or more of your blood glucose levels are high, you may be diagnosed with gestational diabetes. If only one level is high, your health care  provider may suggest repeat testing or other tests to confirm a diagnosis. Talk with your health care provider about what your results mean. Questions to ask your health care provider Ask your health care provider, or the department that is doing the test:  When will my results be ready?  How will I get my results?  What are my treatment options?  What other tests do I need?  What are my next steps? Summary  The glucose tolerance test (GTT) is one of several tests used to diagnose diabetes that develops during pregnancy (gestational diabetes mellitus). Gestational diabetes is a temporary form of diabetes that some women develop during pregnancy.  You may have the GTT test after having a 1-hour glucose screening test if the results from that test indicate that you may have gestational diabetes. You may also have this test if you have any symptoms or risk factors for gestational diabetes.  Talk with your health care provider about what your results mean. This information is not intended to replace advice given to you by your health care provider. Make sure you discuss any questions you have with your health care provider. Document Revised: 06/25/2018 Document Reviewed: 10/14/2016 Elsevier Patient Education  2020 Elsevier Inc.  

## 2019-03-31 NOTE — Progress Notes (Signed)
ROB doing well. Feels good movement. Discussed 28 wk labs at next visit. She verbalizes and agrees to plan . Gad 0, PHQ9 1, pt states she is feeling good. Follow up 2 wk.   Doreene Burke, CNM

## 2019-04-03 LAB — URINE CULTURE

## 2019-04-14 ENCOUNTER — Encounter: Payer: Self-pay | Admitting: Certified Nurse Midwife

## 2019-04-14 ENCOUNTER — Other Ambulatory Visit: Payer: Medicaid Other

## 2019-04-14 ENCOUNTER — Ambulatory Visit (INDEPENDENT_AMBULATORY_CARE_PROVIDER_SITE_OTHER): Payer: Medicaid Other | Admitting: Certified Nurse Midwife

## 2019-04-14 ENCOUNTER — Other Ambulatory Visit: Payer: Self-pay

## 2019-04-14 VITALS — BP 95/61 | HR 102 | Wt 181.7 lb

## 2019-04-14 DIAGNOSIS — Z3493 Encounter for supervision of normal pregnancy, unspecified, third trimester: Secondary | ICD-10-CM

## 2019-04-14 DIAGNOSIS — Z113 Encounter for screening for infections with a predominantly sexual mode of transmission: Secondary | ICD-10-CM

## 2019-04-14 DIAGNOSIS — Z3A28 28 weeks gestation of pregnancy: Secondary | ICD-10-CM

## 2019-04-14 DIAGNOSIS — Z131 Encounter for screening for diabetes mellitus: Secondary | ICD-10-CM

## 2019-04-14 DIAGNOSIS — Z23 Encounter for immunization: Secondary | ICD-10-CM | POA: Diagnosis not present

## 2019-04-14 DIAGNOSIS — Z130 Encounter for screening for diseases of the blood and blood-forming organs and certain disorders involving the immune mechanism: Secondary | ICD-10-CM

## 2019-04-14 LAB — POCT URINALYSIS DIPSTICK OB
Bilirubin, UA: NEGATIVE
Blood, UA: NEGATIVE
Glucose, UA: NEGATIVE
Ketones, UA: NEGATIVE
Leukocytes, UA: NEGATIVE
Nitrite, UA: NEGATIVE
POC,PROTEIN,UA: NEGATIVE
Spec Grav, UA: 1.025 (ref 1.010–1.025)
Urobilinogen, UA: 0.2 E.U./dL
pH, UA: 5 (ref 5.0–8.0)

## 2019-04-14 MED ORDER — TETANUS-DIPHTH-ACELL PERTUSSIS 5-2.5-18.5 LF-MCG/0.5 IM SUSP
0.5000 mL | Freq: Once | INTRAMUSCULAR | Status: AC
  Administered 2019-04-14: 0.5 mL via INTRAMUSCULAR

## 2019-04-14 NOTE — Patient Instructions (Signed)
Td (Tetanus, Diphtheria) Vaccine: What You Need to Know 1. Why get vaccinated? Td vaccine can prevent tetanus and diphtheria. Tetanus enters the body through cuts or wounds. Diphtheria spreads from person to person.  TETANUS (T) causes painful stiffening of the muscles. Tetanus can lead to serious health problems, including being unable to open the mouth, having trouble swallowing and breathing, or death.  DIPHTHERIA (D) can lead to difficulty breathing, heart failure, paralysis, or death. 2. Td vaccine Td is only for children 7 years and older, adolescents, and adults.  Td is usually given as a booster dose every 10 years, but it can also be given earlier after a severe and dirty wound or burn. Another vaccine, called Tdap, that protects against pertussis, also known as "whooping cough," in addition to tetanus and diphtheria, may be used instead of Td.  Td may be given at the same time as other vaccines. 3. Talk with your health care provider Tell your vaccine provider if the person getting the vaccine:  Has had an allergic reaction after a previous dose of any vaccine that protects against tetanus or diphtheria, or has any severe, life-threatening allergies.  Has ever had Guillain-Barr Syndrome (also called GBS).  Has had severe pain or swelling after a previous dose of any vaccine that protects against tetanus or diphtheria. In some cases, your health care provider may decide to postpone Td vaccination to a future visit.  People with minor illnesses, such as a cold, may be vaccinated. People who are moderately or severely ill should usually wait until they recover before getting Td vaccine.  Your health care provider can give you more information. 4. Risks of a vaccine reaction  Pain, redness, or swelling where the shot was given, mild fever, headache, feeling tired, and nausea, vomiting, diarrhea, or stomachache sometimes happen after Td vaccine. People sometimes faint after medical  procedures, including vaccination. Tell your provider if you feel dizzy or have vision changes or ringing in the ears.  As with any medicine, there is a very remote chance of a vaccine causing a severe allergic reaction, other serious injury, or death. 5. What if there is a serious problem? An allergic reaction could occur after the vaccinated person leaves the clinic. If you see signs of a severe allergic reaction (hives, swelling of the face and throat, difficulty breathing, a fast heartbeat, dizziness, or weakness), call 9-1-1 and get the person to the nearest hospital.  For other signs that concern you, call your health care provider.  Adverse reactions should be reported to the Vaccine Adverse Event Reporting System (VAERS). Your health care provider will usually file this report, or you can do it yourself. Visit the VAERS website at www.vaers.hhs.gov or call 1-800-822-7967. VAERS is only for reporting reactions, and VAERS staff do not give medical advice. 6. The National Vaccine Injury Compensation Program The National Vaccine Injury Compensation Program (VICP) is a federal program that was created to compensate people who may have been injured by certain vaccines. Visit the VICP website at www.hrsa.gov/vaccinecompensation or call 1-800-338-2382 to learn about the program and about filing a claim. There is a time limit to file a claim for compensation. 7. How can I learn more?  Ask your health care provider.  Call your local or state health department.  Contact the Centers for Disease Control and Prevention (CDC): ? Call 1-800-232-4636 (1-800-CDC-INFO) or ? Visit CDC's website at www.cdc.gov/vaccines Vaccine Information Statement Td Vaccine (06/17/18) This information is not intended to replace advice given   to you by your health care provider. Make sure you discuss any questions you have with your health care provider. Document Revised: 07/27/2018 Document Reviewed: 06/29/2018 Elsevier  Patient Education  2020 Elsevier Inc.  

## 2019-04-14 NOTE — Progress Notes (Signed)
ROB doing well. Feels good movement. Glucose/Tdap/BTC/RP/CBC today. Information given on bc and sample birth plan. Will follow up at next appointment. She is undecided on the babies name " they have a list". Follow up 2 wk.   Doreene Burke, CNM

## 2019-04-14 NOTE — Progress Notes (Signed)
ROB-No complaints. TDap given and BCF signed.

## 2019-04-14 NOTE — Progress Notes (Signed)
Lactation student provided education in accordance with Ready,Set,Baby curriculum. Mother of baby reported all questions have been answered. 

## 2019-04-15 LAB — CBC
Hematocrit: 34.6 % (ref 34.0–46.6)
Hemoglobin: 11.6 g/dL (ref 11.1–15.9)
MCH: 28.5 pg (ref 26.6–33.0)
MCHC: 33.5 g/dL (ref 31.5–35.7)
MCV: 85 fL (ref 79–97)
Platelets: 289 10*3/uL (ref 150–450)
RBC: 4.07 x10E6/uL (ref 3.77–5.28)
RDW: 13.1 % (ref 11.7–15.4)
WBC: 10.4 10*3/uL (ref 3.4–10.8)

## 2019-04-15 LAB — RPR: RPR Ser Ql: NONREACTIVE

## 2019-04-15 LAB — GLUCOSE, 1 HOUR GESTATIONAL: Gestational Diabetes Screen: 95 mg/dL (ref 65–139)

## 2019-04-26 ENCOUNTER — Other Ambulatory Visit: Payer: Self-pay

## 2019-04-26 ENCOUNTER — Telehealth (INDEPENDENT_AMBULATORY_CARE_PROVIDER_SITE_OTHER): Payer: Medicaid Other | Admitting: Neurology

## 2019-04-26 ENCOUNTER — Encounter: Payer: Self-pay | Admitting: Neurology

## 2019-04-26 VITALS — Ht 62.0 in | Wt 180.0 lb

## 2019-04-26 DIAGNOSIS — G43109 Migraine with aura, not intractable, without status migrainosus: Secondary | ICD-10-CM

## 2019-04-26 DIAGNOSIS — G43009 Migraine without aura, not intractable, without status migrainosus: Secondary | ICD-10-CM | POA: Diagnosis not present

## 2019-04-26 MED ORDER — SERTRALINE HCL 25 MG PO TABS: 25.0000 mg | ORAL_TABLET | Freq: Every day | ORAL | 3 refills | Status: DC

## 2019-04-26 NOTE — Progress Notes (Signed)
Virtual Visit via Video Note The purpose of this virtual visit is to provide medical care while limiting exposure to the novel coronavirus.    Consent was obtained for video visit:  Yes.   Answered questions that patient had about telehealth interaction:  Yes.   I discussed the limitations, risks, security and privacy concerns of performing an evaluation and management service by telemedicine. I also discussed with the patient that there may be a patient responsible charge related to this service. The patient expressed understanding and agreed to proceed.  Pt location: Home Physician Location: office Name of referring provider:  Department, Miranda Prince connected with Tonianne Fine at patients initiation/request on 04/26/2019 at  3:00 PM EST by video enabled telemedicine application and verified that I am speaking with the correct person using two identifiers. Pt MRN:  [id number] Pt DOB:  November 23, 2000 Video Participants:  [name]   History of Present Illness:  The patient was seen as a virtual video visit on 04/26/2019. She was last seen a year ago for migraines and dizziness. She is currently pregnant (baby girl) due on April 21. She reports doing pretty good. She has not been having any headaches. She has occasional dizziness at work. In the past, she had recurrence of migraines and dizziness off sertraline. She has been taking sertraline 25mg  daily throughout her current pregnancy. No side effects. She denies any vision changes, focal numbness/tingling/weakness, no falls. Sleep is good.   History on Initial Assessment 06/10/2016: This is a 19 yo RH woman who presented with worsening headaches and dizziness. She reports symptoms started intermittently at age 33, but have gotten worse the past 4 weeks, now occurring on a daily basis. She has throbbing headaches mostly over the right parietal region, but also over the frontal/vertex region, with associated nausea and dizziness. She  describes the dizziness as either the room spinning or lightheadedness when she walks. Initially they were occurring intermittently with good response to Ibuprofen, however she states that over the past 4 weeks, symptoms have been near-constant. She would have headaches on a daily basis with an intensity of 4 or 5 over 10. The dizziness comes and goes. She has difficulty stating the duration, they mostly worsen when she is doing activities, which has mad it difficult for her to do anything at school. She does report that her grades are fine. She does not take any medication now for these. There is no photo/phonophobia, vomiting, visual obscurations, tinnitus, focal numbness/tingling/weakness in her extremities, but she does have tingling on the right parietal region when it throbs. She has neck and back pain, no diplopia, dysarthria, bowel/bladder dysfunction. She has noticed some trouble swallowing the past 2 weeks. Her mother used to have migraines. She has not had any brain imaging in the past. She reports mood is "neutral." She reports good sleep except when she has headaches, appetite is good.     Current Outpatient Medications on File Prior to Visit  Medication Sig Dispense Refill  . Prenatal Vit-Fe Fumarate-FA (PRENATAL MULTIVITAMIN) TABS tablet Take 1 tablet by mouth daily at 12 noon.    . sertraline (ZOLOFT) 25 MG tablet Take 1 tablet (25 mg total) by mouth daily. 90 tablet 3   No current facility-administered medications on file prior to visit.     Observations/Objective:   Vitals:   04/26/19 1434  Weight: 180 lb (81.6 kg)  Height: 5\' 2"  (1.575 m)   GEN:  The patient appears stated age and is in NAD.  Neurological examination: Patient is awake, alert, oriented x 3. No aphasia or dysarthria. Intact fluency and comprehension. Remote and recent memory intact. Able to name and repeat. Cranial nerves: Extraocular movements intact with no nystagmus. No facial asymmetry. Motor: moves all  extremities symmetrically, at least anti-gravity x 4. No incoordination on finger to nose testing. Gait: narrow-based and steady.   Assessment and Plan:   This is a 19 yo RH woman with a history of headaches and dizziness since childhood suggestive of migraines without aura, who initially presented in 2018 with a change in headaches and dizziness that were occurring on a daily basis. MRI brain normal. 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 She had excellent response to Zoloft, with recurrence of symptoms off medication. She has been back on Zoloft 25mg  daily for the past year with minimal symptoms, she is currently on her third trimester, due in April. Follow-up in 1 year, call for any changes.    Follow Up Instructions:   -I discussed the assessment and treatment plan with the patient. The patient was provided an opportunity to ask questions and all were answered. The patient agreed with the plan and demonstrated an understanding of the instructions.   The patient was advised to call back or seek an in-person evaluation if the symptoms worsen or if the condition fails to improve as anticipated.     May, MD

## 2019-05-03 ENCOUNTER — Ambulatory Visit (INDEPENDENT_AMBULATORY_CARE_PROVIDER_SITE_OTHER): Payer: Medicaid Other | Admitting: Certified Nurse Midwife

## 2019-05-03 ENCOUNTER — Encounter: Payer: Self-pay | Admitting: Certified Nurse Midwife

## 2019-05-03 ENCOUNTER — Other Ambulatory Visit: Payer: Self-pay

## 2019-05-03 VITALS — BP 113/78 | HR 116 | Wt 180.5 lb

## 2019-05-03 DIAGNOSIS — Z3483 Encounter for supervision of other normal pregnancy, third trimester: Secondary | ICD-10-CM

## 2019-05-03 LAB — POCT URINALYSIS DIPSTICK OB
Bilirubin, UA: NEGATIVE
Blood, UA: NEGATIVE
Glucose, UA: NEGATIVE
Ketones, UA: NEGATIVE
Leukocytes, UA: NEGATIVE
Nitrite, UA: NEGATIVE
POC,PROTEIN,UA: NEGATIVE
Spec Grav, UA: >=1.03 — AB (ref 1.010–1.025)
Urobilinogen, UA: 0.2 E.U./dL
pH, UA: 5 (ref 5.0–8.0)

## 2019-05-03 NOTE — Progress Notes (Signed)
ROB doing well. Feels good movement. Birth plan reviewed. Pt request epidural , plans breastfeeding , [place] her partner will be with her in labor , she wants to push as directed, would like augmentation only if necessary, declines use of vacuum and forceps, partner to cut cord. RSB information given today by lactation. Follow up 2 wks with Marcelino Duster.   Doreene Burke, CNM

## 2019-05-03 NOTE — Lactation Note (Signed)
Lactation Consultation Note  Patient Name: Miranda Prince [id number] Date: 05/03/2019   Lactation student discussed benefits of breastfeeding per the Ready, Set, Baby curriculum. Anevay Hach encouraged to review breastfeeding information on Ready, set, [employer] site and given information for virtual breastfeeding classes.   Maternal Data    Feeding    LATCH Score                   Interventions    Lactation Tools Discussed/Used     Consult Status      Cornelious Bryant 05/03/2019, 4:02 PM

## 2019-05-03 NOTE — Patient Instructions (Signed)
[facility]  [facility]  [address], [place], [place] [address]  Phone: [phone]  [facility] (second location)  [address] [place], [place] [address]  Phone: [phone]  [facility] ([place]) [address], [place], [place] [address] Phone: [phone]  [facility]  [address] [place], [place] [address]  Phone: [phone] 

## 2019-05-20 ENCOUNTER — Other Ambulatory Visit: Payer: Self-pay

## 2019-05-20 ENCOUNTER — Ambulatory Visit (INDEPENDENT_AMBULATORY_CARE_PROVIDER_SITE_OTHER): Payer: Medicaid Other | Admitting: Certified Nurse Midwife

## 2019-05-20 VITALS — BP 87/58 | HR 97 | Wt 185.4 lb

## 2019-05-20 DIAGNOSIS — Z3A33 33 weeks gestation of pregnancy: Secondary | ICD-10-CM

## 2019-05-20 DIAGNOSIS — Z3483 Encounter for supervision of other normal pregnancy, third trimester: Secondary | ICD-10-CM

## 2019-05-20 LAB — POCT URINALYSIS DIPSTICK OB
Bilirubin, UA: NEGATIVE
Blood, UA: NEGATIVE
Glucose, UA: NEGATIVE
Ketones, UA: NEGATIVE
Leukocytes, UA: NEGATIVE
Nitrite, UA: NEGATIVE
POC,PROTEIN,UA: NEGATIVE
Spec Grav, UA: 1.02 (ref 1.010–1.025)
Urobilinogen, UA: 0.2 E.U./dL
pH, UA: 6.5 (ref 5.0–8.0)

## 2019-05-20 NOTE — Progress Notes (Signed)
ROB-Doing well, no questions or concerns. Anticipatory guidance regarding course prenatal care. Reviewed red flag symptoms and when to call. RTC x 2 weeks for ROB or sooner if needed.

## 2019-05-20 NOTE — Patient Instructions (Signed)
Fetal Movement Counts Patient Name: ________________________________________________ Patient Due Date: ____________________ What is a fetal movement count?  A fetal movement count is the number of times that you feel your baby move during a certain amount of time. This may also be called a fetal kick count. A fetal movement count is recommended for every pregnant woman. You may be asked to start counting fetal movements as early as week 28 of your pregnancy. Pay attention to when your baby is most active. You may notice your baby's sleep and wake cycles. You may also notice things that make your baby move more. You should do a fetal movement count:  When your baby is normally most active.  At the same time each day. A good time to count movements is while you are resting, after having something to eat and drink. How do I count fetal movements? 1. Find a quiet, comfortable area. Sit, or lie down on your side. 2. Write down the date, the start time and stop time, and the number of movements that you felt between those two times. Take this information with you to your health care visits. 3. Write down your start time when you feel the first movement. 4. Count kicks, flutters, swishes, rolls, and jabs. You should feel at least 10 movements. 5. You may stop counting after you have felt 10 movements, or if you have been counting for 2 hours. Write down the stop time. 6. If you do not feel 10 movements in 2 hours, contact your health care provider for further instructions. Your health care provider may want to do additional tests to assess your baby's well-being. Contact a health care provider if:  You feel fewer than 10 movements in 2 hours.  Your baby is not moving like he or she usually does. Date: ____________ Start time: ____________ Stop time: ____________ Movements: ____________ Date: ____________ Start time: ____________ Stop time: ____________ Movements: ____________ Date: ____________  Start time: ____________ Stop time: ____________ Movements: ____________ Date: ____________ Start time: ____________ Stop time: ____________ Movements: ____________ Date: ____________ Start time: ____________ Stop time: ____________ Movements: ____________ Date: ____________ Start time: ____________ Stop time: ____________ Movements: ____________ Date: ____________ Start time: ____________ Stop time: ____________ Movements: ____________ Date: ____________ Start time: ____________ Stop time: ____________ Movements: ____________ Date: ____________ Start time: ____________ Stop time: ____________ Movements: ____________ This information is not intended to replace advice given to you by your health care provider. Make sure you discuss any questions you have with your health care provider. Document Revised: 10/22/2018 Document Reviewed: 10/22/2018 Elsevier Patient Education  2020 [employer]   Third Trimester of Pregnancy  The third trimester is from week 28 through week 40 (months 7 through 9). This trimester is when your unborn baby (fetus) is growing very fast. At the end of the ninth month, the unborn baby is about 20 inches in length. It weighs about 6-10 pounds. Follow these instructions at home: Medicines  Take over-the-counter and prescription medicines only as told by your doctor. Some medicines are safe and some medicines are not safe during pregnancy.  Take a prenatal vitamin that contains at least 600 micrograms (mcg) of folic acid.  If you have trouble pooping (constipation), take medicine that will make your stool soft (stool softener) if your doctor approves. Eating and drinking   Eat regular, healthy meals.  Avoid raw meat and uncooked cheese.  If you get low calcium from the food you eat, talk to your doctor about taking a daily calcium supplement.    Eat four or five small meals rather than three large meals a day.  Avoid foods that are high in fat and sugars, such as  fried and sweet foods.  To prevent constipation: ? Eat foods that are high in fiber, like fresh fruits and vegetables, whole grains, and beans. ? Drink enough fluids to keep your pee (urine) clear or pale yellow. Activity  Exercise only as told by your doctor. Stop exercising if you start to have cramps.  Avoid heavy lifting, wear low heels, and sit up straight.  Do not exercise if it is too hot, too humid, or if you are in a place of great height (high altitude).  You may continue to have sex unless your doctor tells you not to. Relieving pain and discomfort  Wear a good support bra if your breasts are tender.  Take frequent breaks and rest with your legs raised if you have leg cramps or low back pain.  Take warm water baths (sitz baths) to soothe pain or discomfort caused by hemorrhoids. Use hemorrhoid cream if your doctor approves.  If you develop puffy, bulging veins (varicose veins) in your legs: ? Wear support hose or compression stockings as told by your doctor. ? Raise (elevate) your feet for 15 minutes, 3-4 times a day. ? Limit salt in your food. Safety  Wear your seat belt when driving.  Make a list of emergency phone numbers, including numbers for family, friends, the hospital, and police and fire departments. Preparing for your baby's arrival To prepare for the arrival of your baby:  Take prenatal classes.  Practice driving to the hospital.  Visit the hospital and tour the maternity area.  Talk to your work about taking leave once the baby comes.  Pack your hospital bag.  Prepare the baby's room.  Go to your doctor visits.  Buy a rear-facing car seat. Learn how to install it in your car. General instructions  Do not use hot tubs, steam rooms, or saunas.  Do not use any products that contain nicotine or tobacco, such as cigarettes and e-cigarettes. If you need help quitting, ask your doctor.  Do not drink alcohol.  Do not douche or use tampons or  scented sanitary pads.  Do not cross your legs for long periods of time.  Do not travel for long distances unless you must. Only do so if your doctor says it is okay.  Visit your dentist if you have not gone during your pregnancy. Use a soft toothbrush to brush your teeth. Be gentle when you floss.  Avoid cat litter boxes and soil used by cats. These carry germs that can cause birth defects in the baby and can cause a loss of your baby (miscarriage) or stillbirth.  Keep all your prenatal visits as told by your doctor. This is important. Contact a doctor if:  You are not sure if you are in labor or if your water has broken.  You are dizzy.  You have mild cramps or pressure in your lower belly.  You have a nagging pain in your belly area.  You continue to feel sick to your stomach, you throw up, or you have watery poop.  You have bad smelling fluid coming from your vagina.  You have pain when you pee. Get help right away if:  You have a fever.  You are leaking fluid from your vagina.  You are spotting or bleeding from your vagina.  You have severe belly cramps or pain.  You   lose or gain weight quickly.  You have trouble catching your breath and have chest pain.  You notice sudden or extreme puffiness (swelling) of your face, hands, ankles, feet, or legs.  You have not felt the baby move in over an hour.  You have severe headaches that do not go away with medicine.  You have trouble seeing.  You are leaking, or you are having a gush of fluid, from your vagina before you are 37 weeks.  You have regular belly spasms (contractions) before you are 37 weeks. Summary  The third trimester is from week 28 through week 40 (months 7 through 9). This time is when your unborn baby is growing very fast.  Follow your doctor's advice about medicine, food, and activity.  Get ready for the arrival of your baby by taking prenatal classes, getting all the baby items ready,  preparing the baby's room, and visiting your doctor to be checked.  Get help right away if you are bleeding from your vagina, or you have chest pain and trouble catching your breath, or if you have not felt your baby move in over an hour. This information is not intended to replace advice given to you by your health care provider. Make sure you discuss any questions you have with your health care provider. Document Revised: 06/25/2018 Document Reviewed: 04/09/2016 Elsevier Patient Education  2020 [employer]  

## 2019-06-02 ENCOUNTER — Encounter: Payer: Self-pay | Admitting: Certified Nurse Midwife

## 2019-06-02 ENCOUNTER — Other Ambulatory Visit: Payer: Self-pay

## 2019-06-02 ENCOUNTER — Ambulatory Visit (INDEPENDENT_AMBULATORY_CARE_PROVIDER_SITE_OTHER): Payer: Medicaid Other | Admitting: Certified Nurse Midwife

## 2019-06-02 VITALS — BP 111/62 | HR 101 | Wt 187.6 lb

## 2019-06-02 DIAGNOSIS — Z3A35 35 weeks gestation of pregnancy: Secondary | ICD-10-CM

## 2019-06-02 LAB — POCT URINALYSIS DIPSTICK OB
Bilirubin, UA: NEGATIVE
Blood, UA: NEGATIVE
Glucose, UA: NEGATIVE
Ketones, UA: NEGATIVE
Leukocytes, UA: NEGATIVE
Nitrite, UA: NEGATIVE
POC,PROTEIN,UA: NEGATIVE
Spec Grav, UA: >=1.03 — AB (ref 1.010–1.025)
Urobilinogen, UA: 0.2 E.U./dL
pH, UA: 5 (ref 5.0–8.0)

## 2019-06-02 NOTE — Progress Notes (Signed)
Body mass index is 34.31 kg/m. ROB doing well. No complaints , feels good fetal movement. Discussed GBS testing and SVE next visit. She verbalizes and agrees to plan . Follow up 1 wk with Marcelino Duster.   Doreene Burke, CNM

## 2019-06-02 NOTE — Patient Instructions (Signed)
Group B Streptococcus Infection During Pregnancy °Group B Streptococcus (GBS) is a type of bacteria that is often found in healthy people. It is commonly found in the rectum, vagina, and intestines. In people who are healthy and not pregnant, the bacteria rarely cause serious illness or complications. However, women who test positive for GBS during pregnancy can pass the bacteria to the baby during childbirth. This can cause serious infection in the baby after birth. °Women with GBS may also have infections during their pregnancy or soon after childbirth. The infections include urinary tract infections (UTIs) or infections of the uterus. GBS also increases a woman's risk of complications during pregnancy, such as early labor or delivery, miscarriage, or stillbirth. Routine testing for GBS is recommended for all pregnant women. °What are the causes? °This condition is caused by bacteria called Streptococcus agalactiae. °What increases the risk? °You may have a higher risk for GBS infection during pregnancy if you had one during a past pregnancy. °What are the signs or symptoms? °In most cases, GBS infection does not cause symptoms in pregnant women. If symptoms exist, they may include: °· Labor that starts before the 37th week of pregnancy. °· A UTI or bladder infection. This may cause a fever, frequent urination, or pain and burning during urination. °· Fever during labor. There can also be a rapid heartbeat in the mother or baby. °Rare but serious symptoms of a GBS infection in women include: °· Blood infection (septicemia). This may cause fever, chills, or confusion. °· Lung infection (pneumonia). This may cause fever, chills, cough, rapid breathing, chest pain, or difficulty breathing. °· Bone, joint, skin, or soft tissue infection. °How is this diagnosed? °You may be screened for GBS between week 35 and week 37 of pregnancy. If you have symptoms of preterm labor, you may be screened earlier. This condition is  diagnosed based on lab test results from: °· A swab of fluid from the vagina and rectum. °· A urine sample. °How is this treated? °This condition is treated with antibiotic medicine. Antibiotic medicine may be given: °· To you when you go into labor, or as soon as your water breaks. The medicines will continue until after you give birth. If you are having a cesarean delivery, you do not need antibiotics unless your water has broken. °· To your baby, if he or she requires treatment. Your health care provider will check your baby to decide if he or she needs antibiotics to prevent a serious infection. °Follow these instructions at home: °· Take over-the-counter and prescription medicines only as told by your health care provider. °· Take your antibiotic medicine as told by your health care provider. Do not stop taking the antibiotic even if you start to feel better. °· Keep all pre-birth (prenatal) visits and follow-up visits as told by your health care provider. This is important. °Contact a health care provider if: °· You have pain or burning when you urinate. °· You have to urinate more often than usual. °· You have a fever or chills. °· You develop a bad-smelling vaginal discharge. °Get help right away if: °· Your water breaks. °· You go into labor. °· You have severe pain in your abdomen. °· You have difficulty breathing. °· You have chest pain. °These symptoms may represent a serious problem that is an emergency. Do not wait to see if the symptoms will go away. Get medical help right away. Call your local emergency services (911 in the [place] Do not drive yourself to   the hospital. °Summary °· GBS is a type of bacteria that is common in healthy people. °· During pregnancy, colonization with GBS can cause serious complications for you or your baby. °· Your health care provider will screen you between 35 and 37 weeks of pregnancy to determine if you are colonized with GBS. °· If you are colonized with GBS during  pregnancy, your health care provider will recommend antibiotics through an IV during labor. °· After delivery, your baby will be evaluated for complications related to potential GBS infection and may require antibiotics to prevent a serious infection. °This information is not intended to replace advice given to you by your health care provider. Make sure you discuss any questions you have with your health care provider. °Document Revised: 09/28/2018 Document Reviewed: 09/28/2018 °Elsevier Patient Education © 2020 [employer] ° °

## 2019-06-04 NOTE — Patient Instructions (Addendum)
Third Trimester of Pregnancy  The third trimester is from week 28 through week 40 (months 7 through 9). This trimester is when your unborn baby (fetus) is growing very fast. At the end of the ninth month, the unborn baby is about 20 inches in length. It weighs about 6-10 pounds. Follow these instructions at home: Medicines  Take over-the-counter and prescription medicines only as told by your doctor. Some medicines are safe and some medicines are not safe during pregnancy.  Take a prenatal vitamin that contains at least 600 micrograms (mcg) of folic acid.  If you have trouble pooping (constipation), take medicine that will make your stool soft (stool softener) if your doctor approves. Eating and drinking   Eat regular, healthy meals.  Avoid raw meat and uncooked cheese.  If you get low calcium from the food you eat, talk to your doctor about taking a daily calcium supplement.  Eat four or five small meals rather than three large meals a day.  Avoid foods that are high in fat and sugars, such as fried and sweet foods.  To prevent constipation: ? Eat foods that are high in fiber, like fresh fruits and vegetables, whole grains, and beans. ? Drink enough fluids to keep your pee (urine) clear or pale yellow. Activity  Exercise only as told by your doctor. Stop exercising if you start to have cramps.  Avoid heavy lifting, wear low heels, and sit up straight.  Do not exercise if it is too hot, too humid, or if you are in a place of great height (high altitude).  You may continue to have sex unless your doctor tells you not to. Relieving pain and discomfort  Wear a good support bra if your breasts are tender.  Take frequent breaks and rest with your legs raised if you have leg cramps or low back pain.  Take warm water baths (sitz baths) to soothe pain or discomfort caused by hemorrhoids. Use hemorrhoid cream if your doctor approves.  If you develop puffy, bulging veins (varicose  veins) in your legs: ? Wear support hose or compression stockings as told by your doctor. ? Raise (elevate) your feet for 15 minutes, 3-4 times a day. ? Limit salt in your food. Safety  Wear your seat belt when driving.  Make a list of emergency phone numbers, including numbers for family, friends, the hospital, and police and fire departments. Preparing for your baby's arrival To prepare for the arrival of your baby:  Take prenatal classes.  Practice driving to the hospital.  Visit the hospital and tour the maternity area.  Talk to your work about taking leave once the baby comes.  Pack your hospital bag.  Prepare the baby's room.  Go to your doctor visits.  Buy a rear-facing car seat. Learn how to install it in your car. General instructions  Do not use hot tubs, steam rooms, or saunas.  Do not use any products that contain nicotine or tobacco, such as cigarettes and e-cigarettes. If you need help quitting, ask your doctor.  Do not drink alcohol.  Do not douche or use tampons or scented sanitary pads.  Do not cross your legs for long periods of time.  Do not travel for long distances unless you must. Only do so if your doctor says it is okay.  Visit your dentist if you have not gone during your pregnancy. Use a soft toothbrush to brush your teeth. Be gentle when you floss.  Avoid cat litter boxes and soil   used by cats. These carry germs that can cause birth defects in the baby and can cause a loss of your baby (miscarriage) or stillbirth.  Keep all your prenatal visits as told by your doctor. This is important. Contact a doctor if:  You are not sure if you are in labor or if your water has broken.  You are dizzy.  You have mild cramps or pressure in your lower belly.  You have a nagging pain in your belly area.  You continue to feel sick to your stomach, you throw up, or you have watery poop.  You have bad smelling fluid coming from your vagina.  You have  pain when you pee. Get help right away if:  You have a fever.  You are leaking fluid from your vagina.  You are spotting or bleeding from your vagina.  You have severe belly cramps or pain.  You lose or gain weight quickly.  You have trouble catching your breath and have chest pain.  You notice sudden or extreme puffiness (swelling) of your face, hands, ankles, feet, or legs.  You have not felt the baby move in over an hour.  You have severe headaches that do not go away with medicine.  You have trouble seeing.  You are leaking, or you are having a gush of fluid, from your vagina before you are 37 weeks.  You have regular belly spasms (contractions) before you are 37 weeks. Summary  The third trimester is from week 28 through week 40 (months 7 through 9). This time is when your unborn baby is growing very fast.  Follow your doctor's advice about medicine, food, and activity.  Get ready for the arrival of your baby by taking prenatal classes, getting all the baby items ready, preparing the baby's room, and visiting your doctor to be checked.  Get help right away if you are bleeding from your vagina, or you have chest pain and trouble catching your breath, or if you have not felt your baby move in over an hour. This information is not intended to replace advice given to you by your health care provider. Make sure you discuss any questions you have with your health care provider. Document Revised: 06/25/2018 Document Reviewed: 04/09/2016 Elsevier Patient Education  2020 [employer]. Breastfeeding  Choosing to breastfeed is one of the best decisions you can make for yourself and your baby. A change in hormones during pregnancy causes your breasts to make breast milk in your milk-producing glands. Hormones prevent breast milk from being released before your baby is born. They also prompt milk flow after birth. Once breastfeeding has begun, thoughts of your baby, as well as his or  her sucking or crying, can stimulate the release of milk from your milk-producing glands. Benefits of breastfeeding Research shows that breastfeeding offers many health benefits for infants and mothers. It also offers a cost-free and convenient way to feed your baby. For your baby  Your first milk (colostrum) helps your baby's digestive system to function better.  Special cells in your milk (antibodies) help your baby to fight off infections.  Breastfed babies are less likely to develop asthma, allergies, obesity, or type 2 diabetes. They are also at lower risk for sudden infant death syndrome (SIDS).  Nutrients in breast milk are better able to meet your baby's needs compared to infant formula.  Breast milk improves your baby's brain development. For you  Breastfeeding helps to create a very special bond between you and your  baby.  Breastfeeding is convenient. Breast milk costs nothing and is always available at the correct temperature.  Breastfeeding helps to burn calories. It helps you to lose the weight that you gained during pregnancy.  Breastfeeding makes your uterus return faster to its size before pregnancy. It also slows bleeding (lochia) after you give birth.  Breastfeeding helps to lower your risk of developing type 2 diabetes, osteoporosis, rheumatoid arthritis, cardiovascular disease, and breast, ovarian, uterine, and endometrial cancer later in life. Breastfeeding basics Starting breastfeeding  Find a comfortable place to sit or lie down, with your neck and back well-supported.  Place a pillow or a rolled-up blanket under your baby to bring him or her to the level of your breast (if you are seated). Nursing pillows are specially designed to help support your arms and your baby while you breastfeed.  Make sure that your baby's tummy (abdomen) is facing your abdomen.  Gently massage your breast. With your fingertips, massage from the outer edges of your breast inward  toward the nipple. This encourages milk flow. If your milk flows slowly, you may need to continue this action during the feeding.  Support your breast with 4 fingers underneath and your thumb above your nipple (make the letter "C" with your hand). Make sure your fingers are well away from your nipple and your baby's mouth.  Stroke your baby's lips gently with your finger or nipple.  When your baby's mouth is open wide enough, quickly bring your baby to your breast, placing your entire nipple and as much of the areola as possible into your baby's mouth. The areola is the colored area around your nipple. ? More areola should be visible above your baby's upper lip than below the lower lip. ? Your baby's lips should be opened and extended outward (flanged) to ensure an adequate, comfortable latch. ? Your baby's tongue should be between his or her lower gum and your breast.  Make sure that your baby's mouth is correctly positioned around your nipple (latched). Your baby's lips should create a seal on your breast and be turned out (everted).  It is common for your baby to suck about 2-3 minutes in order to start the flow of breast milk. Latching Teaching your baby how to latch onto your breast properly is very important. An improper latch can cause nipple pain, decreased milk supply, and poor weight gain in your baby. Also, if your baby is not latched onto your nipple properly, he or she may swallow some air during feeding. This can make your baby fussy. Burping your baby when you switch breasts during the feeding can help to get rid of the air. However, teaching your baby to latch on properly is still the best way to prevent fussiness from swallowing air while breastfeeding. Signs that your baby has successfully latched onto your nipple  Silent tugging or silent sucking, without causing you pain. Infant's lips should be extended outward (flanged).  Swallowing heard between every 3-4 sucks once your  milk has started to flow (after your let-down milk reflex occurs).  Muscle movement above and in front of his or her ears while sucking. Signs that your baby has not successfully latched onto your nipple  Sucking sounds or smacking sounds from your baby while breastfeeding.  Nipple pain. If you think your baby has not latched on correctly, slip your finger into the corner of your baby's mouth to break the suction and place it between your baby's gums. Attempt to start breastfeeding  again. Signs of successful breastfeeding Signs from your baby  Your baby will gradually decrease the number of sucks or will completely stop sucking.  Your baby will fall asleep.  Your baby's body will relax.  Your baby will retain a small amount of milk in his or her mouth.  Your baby will let go of your breast by himself or herself. Signs from you  Breasts that have increased in firmness, weight, and size 1-3 hours after feeding.  Breasts that are softer immediately after breastfeeding.  Increased milk volume, as well as a change in milk consistency and color by the fifth day of breastfeeding.  Nipples that are not sore, cracked, or bleeding. Signs that your baby is getting enough milk  Wetting at least 1-2 diapers during the first 24 hours after birth.  Wetting at least 5-6 diapers every 24 hours for the first week after birth. The urine should be clear or pale yellow by the age of 5 days.  Wetting 6-8 diapers every 24 hours as your baby continues to grow and develop.  At least 3 stools in a 24-hour period by the age of 5 days. The stool should be soft and yellow.  At least 3 stools in a 24-hour period by the age of 7 days. The stool should be seedy and yellow.  No loss of weight greater than 10% of birth weight during the first 3 days of life.  Average weight gain of 4-7 oz (113-198 g) per week after the age of 4 days.  Consistent daily weight gain by the age of 5 days, without weight loss  after the age of 2 weeks. After a feeding, your baby may spit up a small amount of milk. This is normal. Breastfeeding frequency and duration Frequent feeding will help you make more milk and can prevent sore nipples and extremely full breasts (breast engorgement). Breastfeed when you feel the need to reduce the fullness of your breasts or when your baby shows signs of hunger. This is called "breastfeeding on demand." Signs that your baby is hungry include:  Increased alertness, activity, or restlessness.  Movement of the head from side to side.  Opening of the mouth when the corner of the mouth or cheek is stroked (rooting).  Increased sucking sounds, smacking lips, cooing, sighing, or squeaking.  Hand-to-mouth movements and sucking on fingers or hands.  Fussing or crying. Avoid introducing a pacifier to your baby in the first 4-6 weeks after your baby is born. After this time, you may choose to use a pacifier. Research has shown that pacifier use during the first year of a baby's life decreases the risk of sudden infant death syndrome (SIDS). Allow your baby to feed on each breast as long as he or she wants. When your baby unlatches or falls asleep while feeding from the first breast, offer the second breast. Because newborns are often sleepy in the first few weeks of life, you may need to awaken your baby to get him or her to feed. Breastfeeding times will vary from baby to baby. However, the following rules can serve as a guide to help you make sure that your baby is properly fed:  Newborns (babies 14 weeks of age or younger) may breastfeed every 1-3 hours.  Newborns should not go without breastfeeding for longer than 3 hours during the day or 5 hours during the night.  You should breastfeed your baby a minimum of 8 times in a 24-hour period. Breast milk pumping  Pumping and storing breast milk allows you to make sure that your baby is exclusively fed your breast milk, even at  times when you are unable to breastfeed. This is especially important if you go back to work while you are still breastfeeding, or if you are not able to be present during feedings. Your lactation consultant can help you find a method of pumping that works best for you and give you guidelines about how long it is safe to store breast milk. Caring for your breasts while you breastfeed Nipples can become dry, cracked, and sore while breastfeeding. The following recommendations can help keep your breasts moisturized and healthy:  Avoid using soap on your nipples.  Wear a supportive bra designed especially for nursing. Avoid wearing underwire-style bras or extremely tight bras (sports bras).  Air-dry your nipples for 3-4 minutes after each feeding.  Use only cotton bra pads to absorb leaked breast milk. Leaking of breast milk between feedings is normal.  Use lanolin on your nipples after breastfeeding. Lanolin helps to maintain your skin's normal moisture barrier. Pure lanolin is not harmful (not toxic) to your baby. You may also hand express a few drops of breast milk and gently massage that milk into your nipples and allow the milk to air-dry. In the first few weeks after giving birth, some women experience breast engorgement. Engorgement can make your breasts feel heavy, warm, and tender to the touch. Engorgement peaks within 3-5 days after you give birth. The following recommendations can help to ease engorgement:  Completely empty your breasts while breastfeeding or pumping. You may want to start by applying warm, moist heat (in the shower or with warm, water-soaked hand towels) just before feeding or pumping. This increases circulation and helps the milk flow. If your baby does not completely empty your breasts while breastfeeding, pump any extra milk after he or she is finished.  Apply ice packs to your breasts immediately after breastfeeding or pumping, unless this is too uncomfortable for you.  To do this: ? Put ice in a plastic bag. ? Place a towel between your skin and the bag. ? Leave the ice on for 20 minutes, 2-3 times a day.  Make sure that your baby is latched on and positioned properly while breastfeeding. If engorgement persists after 48 hours of following these recommendations, contact your health care provider or a Science writer. Overall health care recommendations while breastfeeding  Eat 3 healthy meals and 3 snacks every day. Well-nourished mothers who are breastfeeding need an additional 450-500 calories a day. You can meet this requirement by increasing the amount of a balanced diet that you eat.  Drink enough water to keep your urine pale yellow or clear.  Rest often, relax, and continue to take your prenatal vitamins to prevent fatigue, stress, and low vitamin and mineral levels in your body (nutrient deficiencies).  Do not use any products that contain nicotine or tobacco, such as cigarettes and e-cigarettes. Your baby may be harmed by chemicals from cigarettes that pass into breast milk and exposure to secondhand smoke. If you need help quitting, ask your health care provider.  Avoid alcohol.  Do not use illegal drugs or marijuana.  Talk with your health care provider before taking any medicines. These include over-the-counter and prescription medicines as well as vitamins and herbal supplements. Some medicines that may be harmful to your baby can pass through breast milk.  It is possible to become pregnant while breastfeeding. If birth control is desired, ask  your health care provider about options that will be safe while breastfeeding your baby. Where to find more information: [employer]: www.llli.org Contact a health care provider if:  You feel like you want to stop breastfeeding or have become frustrated with breastfeeding.  Your nipples are cracked or bleeding.  Your breasts are red, tender, or warm.  You have: ? Painful  breasts or nipples. ? A swollen area on either breast. ? A fever or chills. ? Nausea or vomiting. ? Drainage other than breast milk from your nipples.  Your breasts do not become full before feedings by the fifth day after you give birth.  You feel sad and depressed.  Your baby is: ? Too sleepy to eat well. ? Having trouble sleeping. ? More than 55 week old and wetting fewer than 6 diapers in a 24-hour period. ? Not gaining weight by 39 days of age.  Your baby has fewer than 3 stools in a 24-hour period.  Your baby's skin or the white parts of his or her eyes become yellow. Get help right away if:  Your baby is overly tired (lethargic) and does not want to wake up and feed.  Your baby develops an unexplained fever. Summary  Breastfeeding offers many health benefits for infant and mothers.  Try to breastfeed your infant when he or she shows early signs of hunger.  Gently tickle or stroke your baby's lips with your finger or nipple to allow the baby to open his or her mouth. Bring the baby to your breast. Make sure that much of the areola is in your baby's mouth. Offer one side and burp the baby before you offer the other side.  Talk with your health care provider or lactation consultant if you have questions or you face problems as you breastfeed. This information is not intended to replace advice given to you by your health care provider. Make sure you discuss any questions you have with your health care provider. Document Revised: 05/29/2017 Document Reviewed: 04/05/2016 Elsevier Patient Education  2020 [employer]  Fetal Movement Counts Patient Name: ________________________________________________ Patient Due Date: ____________________ What is a fetal movement count?  A fetal movement count is the number of times that you feel your baby move during a certain amount of time. This may also be called a fetal kick count. A fetal movement count is recommended for every  pregnant woman. You may be asked to start counting fetal movements as early as week 28 of your pregnancy. Pay attention to when your baby is most active. You may notice your baby's sleep and wake cycles. You may also notice things that make your baby move more. You should do a fetal movement count:  When your baby is normally most active.  At the same time each day. A good time to count movements is while you are resting, after having something to eat and drink. How do I count fetal movements? 1. Find a quiet, comfortable area. Sit, or lie down on your side. 2. Write down the date, the start time and stop time, and the number of movements that you felt between those two times. Take this information with you to your health care visits. 3. Write down your start time when you feel the first movement. 4. Count kicks, flutters, swishes, rolls, and jabs. You should feel at least 10 movements. 5. You may stop counting after you have felt 10 movements, or if you have been counting for 2 hours. Write down  the stop time. 6. If you do not feel 10 movements in 2 hours, contact your health care provider for further instructions. Your health care provider may want to do additional tests to assess your baby's well-being. Contact a health care provider if:  You feel fewer than 10 movements in 2 hours.  Your baby is not moving like he or she usually does. Date: ____________ Start time: ____________ Stop time: ____________ Movements: ____________ Date: ____________ Start time: ____________ Stop time: ____________ Movements: ____________ Date: ____________ Start time: ____________ Stop time: ____________ Movements: ____________ Date: ____________ Start time: ____________ Stop time: ____________ Movements: ____________ Date: ____________ Start time: ____________ Stop time: ____________ Movements: ____________ Date: ____________ Start time: ____________ Stop time: ____________ Movements: ____________ Date:  ____________ Start time: ____________ Stop time: ____________ Movements: ____________ Date: ____________ Start time: ____________ Stop time: ____________ Movements: ____________ Date: ____________ Start time: ____________ Stop time: ____________ Movements: ____________ This information is not intended to replace advice given to you by your health care provider. Make sure you discuss any questions you have with your health care provider. Document Revised: 10/22/2018 Document Reviewed: 10/22/2018 Elsevier Patient Education  2020 [employer].

## 2019-06-04 NOTE — Progress Notes (Signed)
ROB-Pt present for routine prenatal care and 36 weeks cultures. Pt stated she was doing well other than noticing side pains off and on. UA completed due to positive nit in urine.

## 2019-06-07 ENCOUNTER — Ambulatory Visit (INDEPENDENT_AMBULATORY_CARE_PROVIDER_SITE_OTHER): Payer: Medicaid Other | Admitting: Certified Nurse Midwife

## 2019-06-07 ENCOUNTER — Other Ambulatory Visit: Payer: Self-pay

## 2019-06-07 ENCOUNTER — Encounter: Payer: Self-pay | Admitting: Certified Nurse Midwife

## 2019-06-07 VITALS — BP 91/56 | HR 110 | Wt 186.5 lb

## 2019-06-07 DIAGNOSIS — Z3483 Encounter for supervision of other normal pregnancy, third trimester: Secondary | ICD-10-CM

## 2019-06-07 DIAGNOSIS — Z3A36 36 weeks gestation of pregnancy: Secondary | ICD-10-CM

## 2019-06-07 LAB — POCT URINALYSIS DIPSTICK OB
Bilirubin, UA: NEGATIVE
Blood, UA: NEGATIVE
Ketones, UA: NEGATIVE
Leukocytes, UA: NEGATIVE
Nitrite, UA: POSITIVE
Spec Grav, UA: 1.025 (ref 1.010–1.025)
Urobilinogen, UA: 0.2 E.U./dL
pH, UA: 6.5 (ref 5.0–8.0)

## 2019-06-07 NOTE — Progress Notes (Signed)
ROB-Doing well, reports irregular contractions. 36 week cultures collected, see orders. Requests SVE. Pre-labor checklist, herbal prep, and birth affirmations provided. Anticipatory guidance regarding course of prenatal care. Reviewed red flag symptoms and when to call. RTC x 1 week for ROB or sooner if needed.

## 2019-06-09 LAB — GC/CHLAMYDIA PROBE AMP
Chlamydia trachomatis, NAA: NEGATIVE
Neisseria Gonorrhoeae by PCR: NEGATIVE

## 2019-06-09 LAB — STREP GP B NAA: Strep Gp B NAA: NEGATIVE

## 2019-06-09 LAB — URINE CULTURE

## 2019-06-10 ENCOUNTER — Other Ambulatory Visit: Payer: Self-pay | Admitting: Certified Nurse Midwife

## 2019-06-10 DIAGNOSIS — Z3A36 36 weeks gestation of pregnancy: Secondary | ICD-10-CM

## 2019-06-10 DIAGNOSIS — O2343 Unspecified infection of urinary tract in pregnancy, third trimester: Secondary | ICD-10-CM

## 2019-06-10 DIAGNOSIS — B9620 Unspecified Escherichia coli [E. coli] as the cause of diseases classified elsewhere: Secondary | ICD-10-CM

## 2019-06-10 MED ORDER — NITROFURANTOIN MONOHYD MACRO 100 MG PO CAPS: 100.0000 mg | ORAL_CAPSULE | Freq: Two times a day (BID) | ORAL | 0 refills | Status: DC

## 2019-06-15 ENCOUNTER — Ambulatory Visit (INDEPENDENT_AMBULATORY_CARE_PROVIDER_SITE_OTHER): Payer: Medicaid Other | Admitting: Certified Nurse Midwife

## 2019-06-15 ENCOUNTER — Telehealth: Payer: Self-pay

## 2019-06-15 ENCOUNTER — Encounter: Payer: Self-pay | Admitting: Certified Nurse Midwife

## 2019-06-15 ENCOUNTER — Other Ambulatory Visit: Payer: Self-pay

## 2019-06-15 VITALS — BP 86/50 | HR 97 | Wt 189.0 lb

## 2019-06-15 DIAGNOSIS — Z3A36 36 weeks gestation of pregnancy: Secondary | ICD-10-CM | POA: Diagnosis not present

## 2019-06-15 LAB — POCT URINALYSIS DIPSTICK OB
Bilirubin, UA: NEGATIVE
Blood, UA: NEGATIVE
Glucose, UA: NEGATIVE
Ketones, UA: NEGATIVE
Leukocytes, UA: NEGATIVE
Nitrite, UA: NEGATIVE
POC,PROTEIN,UA: NEGATIVE
Spec Grav, UA: 1.02 (ref 1.010–1.025)
Urobilinogen, UA: 0.2 E.U./dL
pH, UA: 5 (ref 5.0–8.0)

## 2019-06-15 NOTE — Patient Instructions (Signed)
Braxton Hicks Contractions °Contractions of the uterus can occur throughout pregnancy, but they are not always a sign that you are in labor. You may have practice contractions called Braxton Hicks contractions. These false labor contractions are sometimes confused with true labor. °What are Braxton Hicks contractions? °Braxton Hicks contractions are tightening movements that occur in the muscles of the uterus before labor. Unlike true labor contractions, these contractions do not result in opening (dilation) and thinning of the cervix. Toward the end of pregnancy (32-34 weeks), Braxton Hicks contractions can happen more often and may become stronger. These contractions are sometimes difficult to tell apart from true labor because they can be very uncomfortable. You should not feel embarrassed if you go to the hospital with false labor. °Sometimes, the only way to tell if you are in true labor is for your health care provider to look for changes in the cervix. The health care provider will do a physical exam and may monitor your contractions. If you are not in true labor, the exam should show that your cervix is not dilating and your water has not broken. °If there are no other health problems associated with your pregnancy, it is completely safe for you to be sent home with false labor. You may continue to have Braxton Hicks contractions until you go into true labor. °How to tell the difference between true labor and false labor °True labor °· Contractions last 30-70 seconds. °· Contractions become very regular. °· Discomfort is usually felt in the top of the uterus, and it spreads to the lower abdomen and low back. °· Contractions do not go away with walking. °· Contractions usually become more intense and increase in frequency. °· The cervix dilates and gets thinner. °False labor °· Contractions are usually shorter and not as strong as true labor contractions. °· Contractions are usually irregular. °· Contractions  are often felt in the front of the lower abdomen and in the groin. °· Contractions may go away when you walk around or change positions while lying down. °· Contractions get weaker and are shorter-lasting as time goes on. °· The cervix usually does not dilate or become thin. °Follow these instructions at home: ° °· Take over-the-counter and prescription medicines only as told by your health care provider. °· Keep up with your usual exercises and follow other instructions from your health care provider. °· Eat and drink lightly if you think you are going into labor. °· If Braxton Hicks contractions are making you uncomfortable: °? Change your position from lying down or resting to walking, or change from walking to resting. °? Sit and rest in a tub of warm water. °? Drink enough fluid to keep your urine pale yellow. Dehydration may cause these contractions. °? Do slow and deep breathing several times an hour. °· Keep all follow-up prenatal visits as told by your health care provider. This is important. °Contact a health care provider if: °· You have a fever. °· You have continuous pain in your abdomen. °Get help right away if: °· Your contractions become stronger, more regular, and closer together. °· You have fluid leaking or gushing from your vagina. °· You pass blood-tinged mucus (bloody show). °· You have bleeding from your vagina. °· You have low back pain that you never had before. °· You feel your baby’s head pushing down and causing pelvic pressure. °· Your baby is not moving inside you as much as it used to. °Summary °· Contractions that occur before labor are   called Braxton Hicks contractions, false labor, or practice contractions. °· Braxton Hicks contractions are usually shorter, weaker, farther apart, and less regular than true labor contractions. True labor contractions usually become progressively stronger and regular, and they become more frequent. °· Manage discomfort from Braxton Hicks contractions  by changing position, resting in a warm bath, drinking plenty of water, or practicing deep breathing. °This information is not intended to replace advice given to you by your health care provider. Make sure you discuss any questions you have with your health care provider. °Document Revised: 02/14/2017 Document Reviewed: 07/18/2016 °Elsevier Patient Education © 2020 [employer] ° °

## 2019-06-15 NOTE — Telephone Encounter (Signed)
mychart message sent to patient

## 2019-06-15 NOTE — Progress Notes (Signed)
ROB doing well. Feels good movement. Discussed ready set baby initiative. Exclusive breastfeeding, risk of giving anything else, rooming in , frequent feedings. Completed all education check list recommendations. Discussed labor precautions. Follow up 1 wk with Marcelino Duster.   Doreene Burke, CNM

## 2019-06-22 ENCOUNTER — Other Ambulatory Visit: Payer: Self-pay

## 2019-06-22 ENCOUNTER — Ambulatory Visit (INDEPENDENT_AMBULATORY_CARE_PROVIDER_SITE_OTHER): Payer: Medicaid Other | Admitting: Certified Nurse Midwife

## 2019-06-22 VITALS — BP 112/67 | HR 94 | Wt 194.3 lb

## 2019-06-22 DIAGNOSIS — Z3A37 37 weeks gestation of pregnancy: Secondary | ICD-10-CM | POA: Diagnosis not present

## 2019-06-22 LAB — POCT URINALYSIS DIPSTICK OB
Bilirubin, UA: NEGATIVE
Blood, UA: NEGATIVE
Glucose, UA: NEGATIVE
Ketones, UA: NEGATIVE
Leukocytes, UA: NEGATIVE
Nitrite, UA: NEGATIVE
POC,PROTEIN,UA: NEGATIVE
Spec Grav, UA: 1.015 (ref 1.010–1.025)
Urobilinogen, UA: 0.2 E.U./dL
pH, UA: 6.5 (ref 5.0–8.0)

## 2019-06-22 NOTE — Patient Instructions (Signed)
Braxton Hicks Contractions °Contractions of the uterus can occur throughout pregnancy, but they are not always a sign that you are in labor. You may have practice contractions called Braxton Hicks contractions. These false labor contractions are sometimes confused with true labor. °What are Braxton Hicks contractions? °Braxton Hicks contractions are tightening movements that occur in the muscles of the uterus before labor. Unlike true labor contractions, these contractions do not result in opening (dilation) and thinning of the cervix. Toward the end of pregnancy (32-34 weeks), Braxton Hicks contractions can happen more often and may become stronger. These contractions are sometimes difficult to tell apart from true labor because they can be very uncomfortable. You should not feel embarrassed if you go to the hospital with false labor. °Sometimes, the only way to tell if you are in true labor is for your health care provider to look for changes in the cervix. The health care provider will do a physical exam and may monitor your contractions. If you are not in true labor, the exam should show that your cervix is not dilating and your water has not broken. °If there are no other health problems associated with your pregnancy, it is completely safe for you to be sent home with false labor. You may continue to have Braxton Hicks contractions until you go into true labor. °How to tell the difference between true labor and false labor °True labor °· Contractions last 30-70 seconds. °· Contractions become very regular. °· Discomfort is usually felt in the top of the uterus, and it spreads to the lower abdomen and low back. °· Contractions do not go away with walking. °· Contractions usually become more intense and increase in frequency. °· The cervix dilates and gets thinner. °False labor °· Contractions are usually shorter and not as strong as true labor contractions. °· Contractions are usually irregular. °· Contractions  are often felt in the front of the lower abdomen and in the groin. °· Contractions may go away when you walk around or change positions while lying down. °· Contractions get weaker and are shorter-lasting as time goes on. °· The cervix usually does not dilate or become thin. °Follow these instructions at home: ° °· Take over-the-counter and prescription medicines only as told by your health care provider. °· Keep up with your usual exercises and follow other instructions from your health care provider. °· Eat and drink lightly if you think you are going into labor. °· If Braxton Hicks contractions are making you uncomfortable: °? Change your position from lying down or resting to walking, or change from walking to resting. °? Sit and rest in a tub of warm water. °? Drink enough fluid to keep your urine pale yellow. Dehydration may cause these contractions. °? Do slow and deep breathing several times an hour. °· Keep all follow-up prenatal visits as told by your health care provider. This is important. °Contact a health care provider if: °· You have a fever. °· You have continuous pain in your abdomen. °Get help right away if: °· Your contractions become stronger, more regular, and closer together. °· You have fluid leaking or gushing from your vagina. °· You pass blood-tinged mucus (bloody show). °· You have bleeding from your vagina. °· You have low back pain that you never had before. °· You feel your baby’s head pushing down and causing pelvic pressure. °· Your baby is not moving inside you as much as it used to. °Summary °· Contractions that occur before labor are   called Braxton Hicks contractions, false labor, or practice contractions. °· Braxton Hicks contractions are usually shorter, weaker, farther apart, and less regular than true labor contractions. True labor contractions usually become progressively stronger and regular, and they become more frequent. °· Manage discomfort from Braxton Hicks contractions  by changing position, resting in a warm bath, drinking plenty of water, or practicing deep breathing. °This information is not intended to replace advice given to you by your health care provider. Make sure you discuss any questions you have with your health care provider. °Document Revised: 02/14/2017 Document Reviewed: 07/18/2016 °Elsevier Patient Education © 2020 [employer] ° °

## 2019-06-22 NOTE — Progress Notes (Signed)
ROB doing well. Feels good movement. Pt states that she has not completed the antibiotics for the UTI yet. States she has missed some doses. Expressed importance of taking medication as ordered. She verbalizes understanding. Will do culture next visit. Reviewed labor precautions. Discussed induction at 41 wks if needed. Follow up 1 wk with Marcelino Duster.   Doreene Burke, CNM

## 2019-07-01 ENCOUNTER — Other Ambulatory Visit: Payer: Self-pay

## 2019-07-01 ENCOUNTER — Ambulatory Visit (INDEPENDENT_AMBULATORY_CARE_PROVIDER_SITE_OTHER): Payer: Medicaid Other | Admitting: Certified Nurse Midwife

## 2019-07-01 VITALS — BP 90/62 | HR 96 | Wt 197.0 lb

## 2019-07-01 DIAGNOSIS — Z3A39 39 weeks gestation of pregnancy: Secondary | ICD-10-CM

## 2019-07-01 LAB — POCT URINALYSIS DIPSTICK OB
Bilirubin, UA: NEGATIVE
Blood, UA: NEGATIVE
Glucose, UA: NEGATIVE
Ketones, UA: NEGATIVE
Leukocytes, UA: NEGATIVE
Nitrite, UA: NEGATIVE
POC,PROTEIN,UA: NEGATIVE
Spec Grav, UA: 1.015 (ref 1.010–1.025)
Urobilinogen, UA: 0.2 E.U./dL
pH, UA: 5 (ref 5.0–8.0)

## 2019-07-01 NOTE — Patient Instructions (Signed)
Fetal Movement Counts Patient Name: ________________________________________________ Patient Due Date: ____________________ What is a fetal movement count?  A fetal movement count is the number of times that you feel your baby move during a certain amount of time. This may also be called a fetal kick count. A fetal movement count is recommended for every pregnant woman. You may be asked to start counting fetal movements as early as week 28 of your pregnancy. Pay attention to when your baby is most active. You may notice your baby's sleep and wake cycles. You may also notice things that make your baby move more. You should do a fetal movement count:  When your baby is normally most active.  At the same time each day. A good time to count movements is while you are resting, after having something to eat and drink. How do I count fetal movements? 1. Find a quiet, comfortable area. Sit, or lie down on your side. 2. Write down the date, the start time and stop time, and the number of movements that you felt between those two times. Take this information with you to your health care visits. 3. Write down your start time when you feel the first movement. 4. Count kicks, flutters, swishes, rolls, and jabs. You should feel at least 10 movements. 5. You may stop counting after you have felt 10 movements, or if you have been counting for 2 hours. Write down the stop time. 6. If you do not feel 10 movements in 2 hours, contact your health care provider for further instructions. Your health care provider may want to do additional tests to assess your baby's well-being. Contact a health care provider if:  You feel fewer than 10 movements in 2 hours.  Your baby is not moving like he or she usually does. Date: ____________ Start time: ____________ Stop time: ____________ Movements: ____________ Date: ____________ Start time: ____________ Stop time: ____________ Movements: ____________ Date: ____________  Start time: ____________ Stop time: ____________ Movements: ____________ Date: ____________ Start time: ____________ Stop time: ____________ Movements: ____________ Date: ____________ Start time: ____________ Stop time: ____________ Movements: ____________ Date: ____________ Start time: ____________ Stop time: ____________ Movements: ____________ Date: ____________ Start time: ____________ Stop time: ____________ Movements: ____________ Date: ____________ Start time: ____________ Stop time: ____________ Movements: ____________ Date: ____________ Start time: ____________ Stop time: ____________ Movements: ____________ This information is not intended to replace advice given to you by your health care provider. Make sure you discuss any questions you have with your health care provider. Document Revised: 10/22/2018 Document Reviewed: 10/22/2018 Elsevier Patient Education  2020 Elsevier Inc.  

## 2019-07-03 NOTE — Progress Notes (Signed)
ROB-Doing well, reports low back pain. Discussed home treatment measures and labor preparation techniques. Anticipatory guidance regarding course of prenatal care. Reviewed red flag symptoms and when to call. RTC x 1 week for ROB or sooner if needed.

## 2019-07-07 ENCOUNTER — Encounter: Payer: Self-pay | Admitting: Certified Nurse Midwife

## 2019-07-07 ENCOUNTER — Other Ambulatory Visit: Payer: Self-pay

## 2019-07-07 ENCOUNTER — Ambulatory Visit (INDEPENDENT_AMBULATORY_CARE_PROVIDER_SITE_OTHER): Payer: Medicaid Other | Admitting: Certified Nurse Midwife

## 2019-07-07 ENCOUNTER — Other Ambulatory Visit: Payer: Self-pay | Admitting: Certified Nurse Midwife

## 2019-07-07 ENCOUNTER — Other Ambulatory Visit (INDEPENDENT_AMBULATORY_CARE_PROVIDER_SITE_OTHER): Payer: Medicaid Other

## 2019-07-07 VITALS — BP 127/67 | HR 106 | Wt 197.2 lb

## 2019-07-07 DIAGNOSIS — O480 Post-term pregnancy: Secondary | ICD-10-CM | POA: Diagnosis not present

## 2019-07-07 DIAGNOSIS — Z3A40 40 weeks gestation of pregnancy: Secondary | ICD-10-CM

## 2019-07-07 NOTE — Progress Notes (Addendum)
ROB doing well. Feels good movement. Discussed labor precautions. SVE per pt request 3/50/-2. Discussed induction for post dates. She verbalizes and agrees to plan. Scheduled for Monday 4/26 @ 0500. Covid testing on Thursday. U/s today for growth/AFI ( see below) . Induction scheduled for Monday 4/26 @ 0500.   Patient Name: Miranda Prince DOB: 2000-07-30 MRN: [id number] ULTRASOUND REPORT  Location: [facility] Date of Service: 07/07/2019   Indications:growth/afi Findings:  Miranda Prince intrauterine pregnancy is visualized with FHR at 132 BPM. Biometrics give an (U/S) Gestational age of [redacted]w[redacted]d and an (U/S) EDD of 07/22/2019; this correlates with the clinically established Estimated Date of Delivery: 07/07/19.  Fetal presentation is Cephalic.  Placenta: anterior. Grade: 1 AFI: 14.7 cm  Growth percentile is 45. EFW: 3410 g ( 7 lbs 8 oz)   Impression: 1. [redacted]w[redacted]d Viable Singleton Intrauterine pregnancy previously established criteria. 2. Growth is 45 %ile.  AFI is 14.7 cm.   Recommendations: 1.Clinical correlation with the patient's History and Physical Exam.   Miranda  M. Marciano Prince     RDMS

## 2019-07-07 NOTE — Patient Instructions (Signed)
Braxton Hicks Contractions °Contractions of the uterus can occur throughout pregnancy, but they are not always a sign that you are in labor. You may have practice contractions called Braxton Hicks contractions. These false labor contractions are sometimes confused with true labor. °What are Braxton Hicks contractions? °Braxton Hicks contractions are tightening movements that occur in the muscles of the uterus before labor. Unlike true labor contractions, these contractions do not result in opening (dilation) and thinning of the cervix. Toward the end of pregnancy (32-34 weeks), Braxton Hicks contractions can happen more often and may become stronger. These contractions are sometimes difficult to tell apart from true labor because they can be very uncomfortable. You should not feel embarrassed if you go to the hospital with false labor. °Sometimes, the only way to tell if you are in true labor is for your health care provider to look for changes in the cervix. The health care provider will do a physical exam and may monitor your contractions. If you are not in true labor, the exam should show that your cervix is not dilating and your water has not broken. °If there are no other health problems associated with your pregnancy, it is completely safe for you to be sent home with false labor. You may continue to have Braxton Hicks contractions until you go into true labor. °How to tell the difference between true labor and false labor °True labor °· Contractions last 30-70 seconds. °· Contractions become very regular. °· Discomfort is usually felt in the top of the uterus, and it spreads to the lower abdomen and low back. °· Contractions do not go away with walking. °· Contractions usually become more intense and increase in frequency. °· The cervix dilates and gets thinner. °False labor °· Contractions are usually shorter and not as strong as true labor contractions. °· Contractions are usually irregular. °· Contractions  are often felt in the front of the lower abdomen and in the groin. °· Contractions may go away when you walk around or change positions while lying down. °· Contractions get weaker and are shorter-lasting as time goes on. °· The cervix usually does not dilate or become thin. °Follow these instructions at home: ° °· Take over-the-counter and prescription medicines only as told by your health care provider. °· Keep up with your usual exercises and follow other instructions from your health care provider. °· Eat and drink lightly if you think you are going into labor. °· If Braxton Hicks contractions are making you uncomfortable: °? Change your position from lying down or resting to walking, or change from walking to resting. °? Sit and rest in a tub of warm water. °? Drink enough fluid to keep your urine pale yellow. Dehydration may cause these contractions. °? Do slow and deep breathing several times an hour. °· Keep all follow-up prenatal visits as told by your health care provider. This is important. °Contact a health care provider if: °· You have a fever. °· You have continuous pain in your abdomen. °Get help right away if: °· Your contractions become stronger, more regular, and closer together. °· You have fluid leaking or gushing from your vagina. °· You pass blood-tinged mucus (bloody show). °· You have bleeding from your vagina. °· You have low back pain that you never had before. °· You feel your baby’s head pushing down and causing pelvic pressure. °· Your baby is not moving inside you as much as it used to. °Summary °· Contractions that occur before labor are   called Braxton Hicks contractions, false labor, or practice contractions. °· Braxton Hicks contractions are usually shorter, weaker, farther apart, and less regular than true labor contractions. True labor contractions usually become progressively stronger and regular, and they become more frequent. °· Manage discomfort from Braxton Hicks contractions  by changing position, resting in a warm bath, drinking plenty of water, or practicing deep breathing. °This information is not intended to replace advice given to you by your health care provider. Make sure you discuss any questions you have with your health care provider. °Document Revised: 02/14/2017 Document Reviewed: 07/18/2016 °Elsevier Patient Education © 2020 [employer] ° °

## 2019-07-08 ENCOUNTER — Other Ambulatory Visit
Admission: RE | Admit: 2019-07-08 | Discharge: 2019-07-08 | Disposition: A | Discharge status: Home / Self Care | Payer: Medicaid Other | Source: Ambulatory Visit | Attending: Certified Nurse Midwife | Admitting: Certified Nurse Midwife

## 2019-07-08 DIAGNOSIS — Z01812 Encounter for preprocedural laboratory examination: Secondary | ICD-10-CM | POA: Diagnosis not present

## 2019-07-08 DIAGNOSIS — Z20822 Contact with and (suspected) exposure to covid-19: Secondary | ICD-10-CM | POA: Diagnosis not present

## 2019-07-08 LAB — SARS CORONAVIRUS 2 (TAT 6-24 HRS): SARS Coronavirus 2: NEGATIVE

## 2019-07-11 ENCOUNTER — Other Ambulatory Visit: Payer: Self-pay | Admitting: Certified Nurse Midwife

## 2019-07-12 ENCOUNTER — Inpatient Hospital Stay: Payer: Medicaid Other | Admitting: Anesthesiology

## 2019-07-12 ENCOUNTER — Inpatient Hospital Stay
Admission: RE | Admit: 2019-07-12 | Discharge: 2019-07-13 | DRG: 807 | Disposition: A | Discharge status: Home / Self Care | Payer: Medicaid Other | Attending: Certified Nurse Midwife | Admitting: Certified Nurse Midwife

## 2019-07-12 ENCOUNTER — Other Ambulatory Visit: Payer: Self-pay

## 2019-07-12 ENCOUNTER — Encounter: Payer: Self-pay | Admitting: Certified Nurse Midwife

## 2019-07-12 DIAGNOSIS — B9620 Unspecified Escherichia coli [E. coli] as the cause of diseases classified elsewhere: Secondary | ICD-10-CM

## 2019-07-12 DIAGNOSIS — O480 Post-term pregnancy: Secondary | ICD-10-CM | POA: Diagnosis present

## 2019-07-12 DIAGNOSIS — Z8759 Personal history of other complications of pregnancy, childbirth and the puerperium: Secondary | ICD-10-CM

## 2019-07-12 DIAGNOSIS — Z3A40 40 weeks gestation of pregnancy: Secondary | ICD-10-CM | POA: Diagnosis not present

## 2019-07-12 LAB — CBC
HCT: 34.3 % — ABNORMAL LOW (ref 36.0–46.0)
Hemoglobin: 11 g/dL — ABNORMAL LOW (ref 12.0–15.0)
MCH: 26.8 pg (ref 26.0–34.0)
MCHC: 32.1 g/dL (ref 30.0–36.0)
MCV: 83.5 fL (ref 80.0–100.0)
Platelets: 234 10*3/uL (ref 150–400)
RBC: 4.11 MIL/uL (ref 3.87–5.11)
RDW: 15 % (ref 11.5–15.5)
WBC: 10.2 10*3/uL (ref 4.0–10.5)
nRBC: 0 % (ref 0.0–0.2)

## 2019-07-12 LAB — TYPE AND SCREEN
ABO/RH(D): O POS
Antibody Screen: NEGATIVE

## 2019-07-12 MED ORDER — LACTATED RINGERS IV SOLN
500.0000 mL | INTRAVENOUS | Status: DC | PRN
  Administered 2019-07-12: 500 mL via INTRAVENOUS

## 2019-07-12 MED ORDER — OXYTOCIN 40 UNITS IN NORMAL SALINE INFUSION - SIMPLE MED: 2.5000 [IU]/h | INTRAVENOUS | Status: DC

## 2019-07-12 MED ORDER — ACETAMINOPHEN 325 MG PO TABS
650.0000 mg | ORAL_TABLET | ORAL | Status: DC | PRN
  Administered 2019-07-12: 650 mg via ORAL
  Filled 2019-07-12: qty 2

## 2019-07-12 MED ORDER — PRENATAL MULTIVITAMIN CH
1.0000 | ORAL_TABLET | Freq: Every day | ORAL | Status: DC
  Administered 2019-07-13: 1 via ORAL
  Filled 2019-07-12: qty 1

## 2019-07-12 MED ORDER — SIMETHICONE 80 MG PO CHEW
80.0000 mg | CHEWABLE_TABLET | ORAL | Status: DC | PRN
  Administered 2019-07-13: 80 mg via ORAL
  Filled 2019-07-12: qty 1

## 2019-07-12 MED ORDER — DIPHENHYDRAMINE HCL 50 MG/ML IJ SOLN: 12.5000 mg | INTRAMUSCULAR | Status: DC | PRN

## 2019-07-12 MED ORDER — DIBUCAINE (PERIANAL) 1 % EX OINT: 1.0000 "application " | TOPICAL_OINTMENT | CUTANEOUS | Status: DC | PRN

## 2019-07-12 MED ORDER — TERBUTALINE SULFATE 1 MG/ML IJ SOLN: 0.2500 mg | Freq: Once | INTRAMUSCULAR | Status: DC | PRN

## 2019-07-12 MED ORDER — PHENYLEPHRINE 40 MCG/ML (10ML) SYRINGE FOR IV PUSH (FOR BLOOD PRESSURE SUPPORT): 80.0000 ug | PREFILLED_SYRINGE | INTRAVENOUS | Status: DC | PRN

## 2019-07-12 MED ORDER — ONDANSETRON HCL 4 MG PO TABS: 4.0000 mg | ORAL_TABLET | ORAL | Status: DC | PRN

## 2019-07-12 MED ORDER — BUTORPHANOL TARTRATE 1 MG/ML IJ SOLN: 1.0000 mg | INTRAMUSCULAR | Status: DC | PRN

## 2019-07-12 MED ORDER — OXYCODONE-ACETAMINOPHEN 5-325 MG PO TABS: 2.0000 | ORAL_TABLET | ORAL | Status: DC | PRN

## 2019-07-12 MED ORDER — LACTATED RINGERS IV SOLN: INTRAVENOUS | Status: DC

## 2019-07-12 MED ORDER — EPHEDRINE 5 MG/ML INJ: 10.0000 mg | INTRAVENOUS | Status: DC | PRN

## 2019-07-12 MED ORDER — MISOPROSTOL 200 MCG PO TABS
ORAL_TABLET | ORAL | Status: AC
  Filled 2019-07-12: qty 4

## 2019-07-12 MED ORDER — OXYTOCIN BOLUS FROM INFUSION
500.0000 mL | Freq: Once | INTRAVENOUS | Status: AC
  Administered 2019-07-12: 500 mL via INTRAVENOUS

## 2019-07-12 MED ORDER — OXYTOCIN 40 UNITS IN NORMAL SALINE INFUSION - SIMPLE MED
1.0000 m[IU]/min | INTRAVENOUS | Status: DC
  Administered 2019-07-12: 4 m[IU]/min via INTRAVENOUS
  Filled 2019-07-12: qty 1000

## 2019-07-12 MED ORDER — MISOPROSTOL 50MCG HALF TABLET: 50.0000 ug | ORAL_TABLET | ORAL | Status: DC | PRN

## 2019-07-12 MED ORDER — SODIUM CHLORIDE 0.9 % IV SOLN
INTRAVENOUS | Status: DC | PRN
  Administered 2019-07-12 (×2): 5 mL via EPIDURAL

## 2019-07-12 MED ORDER — OXYTOCIN 10 UNIT/ML IJ SOLN
INTRAMUSCULAR | Status: AC
  Filled 2019-07-12: qty 2

## 2019-07-12 MED ORDER — LACTATED RINGERS IV SOLN
500.0000 mL | Freq: Once | INTRAVENOUS | Status: AC
  Administered 2019-07-12: 09:00:00 500 mL via INTRAVENOUS

## 2019-07-12 MED ORDER — IBUPROFEN 600 MG PO TABS
600.0000 mg | ORAL_TABLET | Freq: Four times a day (QID) | ORAL | Status: DC
  Administered 2019-07-12 – 2019-07-13 (×4): 600 mg via ORAL
  Filled 2019-07-12 (×4): qty 1

## 2019-07-12 MED ORDER — ONDANSETRON HCL 4 MG/2ML IJ SOLN: 4.0000 mg | INTRAMUSCULAR | Status: DC | PRN

## 2019-07-12 MED ORDER — BUPIVACAINE HCL (PF) 0.25 % IJ SOLN
INTRAMUSCULAR | Status: DC | PRN
  Administered 2019-07-12 (×2): 3 mL via EPIDURAL

## 2019-07-12 MED ORDER — WITCH HAZEL-GLYCERIN EX PADS: 1.0000 "application " | MEDICATED_PAD | CUTANEOUS | Status: DC | PRN

## 2019-07-12 MED ORDER — OXYCODONE-ACETAMINOPHEN 5-325 MG PO TABS: 1.0000 | ORAL_TABLET | ORAL | Status: DC | PRN

## 2019-07-12 MED ORDER — SOD CITRATE-CITRIC ACID 500-334 MG/5ML PO SOLN: 30.0000 mL | ORAL | Status: DC | PRN

## 2019-07-12 MED ORDER — SENNOSIDES-DOCUSATE SODIUM 8.6-50 MG PO TABS
2.0000 | ORAL_TABLET | ORAL | Status: DC
  Administered 2019-07-13: 2 via ORAL
  Filled 2019-07-12: qty 2

## 2019-07-12 MED ORDER — LIDOCAINE HCL (PF) 1 % IJ SOLN
INTRAMUSCULAR | Status: AC
  Filled 2019-07-12: qty 30

## 2019-07-12 MED ORDER — ONDANSETRON HCL 4 MG/2ML IJ SOLN: 4.0000 mg | Freq: Four times a day (QID) | INTRAMUSCULAR | Status: DC | PRN

## 2019-07-12 MED ORDER — COCONUT OIL OIL: 1.0000 "application " | TOPICAL_OIL | Status: DC | PRN

## 2019-07-12 MED ORDER — TETANUS-DIPHTH-ACELL PERTUSSIS 5-2.5-18.5 LF-MCG/0.5 IM SUSP: 0.5000 mL | Freq: Once | INTRAMUSCULAR | Status: DC

## 2019-07-12 MED ORDER — FERROUS SULFATE 325 (65 FE) MG PO TABS
325.0000 mg | ORAL_TABLET | Freq: Every day | ORAL | Status: DC
  Administered 2019-07-13: 09:00:00 325 mg via ORAL
  Filled 2019-07-12: qty 1

## 2019-07-12 MED ORDER — LIDOCAINE HCL (PF) 1 % IJ SOLN: 30.0000 mL | INTRAMUSCULAR | Status: DC | PRN

## 2019-07-12 MED ORDER — AMMONIA AROMATIC IN INHA
RESPIRATORY_TRACT | Status: AC
  Filled 2019-07-12: qty 10

## 2019-07-12 MED ORDER — DOCUSATE SODIUM 100 MG PO CAPS
100.0000 mg | ORAL_CAPSULE | Freq: Two times a day (BID) | ORAL | Status: DC
  Administered 2019-07-12 – 2019-07-13 (×2): 100 mg via ORAL
  Filled 2019-07-12 (×2): qty 1

## 2019-07-12 MED ORDER — LIDOCAINE-EPINEPHRINE (PF) 1.5 %-1:200000 IJ SOLN
INTRAMUSCULAR | Status: DC | PRN
  Administered 2019-07-12: 3 mL via PERINEURAL

## 2019-07-12 MED ORDER — ACETAMINOPHEN 325 MG PO TABS: 650.0000 mg | ORAL_TABLET | ORAL | Status: DC | PRN

## 2019-07-12 MED ORDER — IBUPROFEN 600 MG PO TABS
600.0000 mg | ORAL_TABLET | Freq: Four times a day (QID) | ORAL | Status: DC
  Administered 2019-07-12: 15:00:00 600 mg via ORAL
  Filled 2019-07-12: qty 1

## 2019-07-12 MED ORDER — FENTANYL 2.5 MCG/ML W/ROPIVACAINE 0.15% IN NS 100 ML EPIDURAL (ARMC)
12.0000 mL/h | EPIDURAL | Status: DC
  Administered 2019-07-12: 10:00:00 12 mL/h via EPIDURAL

## 2019-07-12 MED ORDER — BENZOCAINE-MENTHOL 20-0.5 % EX AERO
1.0000 "application " | INHALATION_SPRAY | CUTANEOUS | Status: DC | PRN
  Administered 2019-07-12: 1 via TOPICAL
  Filled 2019-07-12 (×2): qty 56

## 2019-07-12 MED ORDER — FENTANYL 2.5 MCG/ML W/ROPIVACAINE 0.15% IN NS 100 ML EPIDURAL (ARMC)
EPIDURAL | Status: AC
  Filled 2019-07-12: qty 100

## 2019-07-12 NOTE — Progress Notes (Signed)
LABOR NOTE   Miranda Prince 19 y.o.@ at [redacted]w[redacted]d  SUBJECTIVE:  Pt is resting comfortably in bed. Denies urge to push.  Analgesia: Epidural  OBJECTIVE:  BP 117/64 (BP Location: Right Arm)   Pulse 87   Temp 97.6 F (36.4 C) (Oral)   Resp 18   Ht 5\' 1"  (1.549 m)   Wt 87.5 kg   LMP 10/05/2018   SpO2 100%   BMI 36.47 kg/m  No intake/output data recorded.  She has shown cervical change. CERVIX: 9.5:  100%:   -1:   mid position:   floppy SVE:   Dilation: Lip/rim Effacement (%): 90 Station: -1 Exam by:: A.Kenzly Rogoff,CNM CONTRACTIONS: irregular, every 2 minutes FHR: Fetal heart tracing reviewed. Baseline: 120 bpm, Variability: Good {> 6 bpm), Accelerations: Reactive and Decelerations: variable Category II    Labs: Lab Results  Component Value Date   WBC 10.2 07/12/2019   HGB 11.0 (L) 07/12/2019   HCT 34.3 (L) 07/12/2019   MCV 83.5 07/12/2019   PLT 234 07/12/2019    ASSESSMENT: 1) Labor curve reviewed.       Progress: Active phase labor.     Membranes: ruptured, clear fluid           Active Problems:   Labor and delivery, indication for care   PLAN: continue present management   07/14/2019, CNM  07/12/2019 11:56 AM

## 2019-07-12 NOTE — H&P (Signed)
History and Physical   HPI  Miranda Prince is a 19 y.o. G2P1001 at [redacted]w[redacted]d Estimated Date of Delivery: 07/07/19 who is being admitted for induction of labor for postdates.   OB History  OB History  Gravida Para Term Preterm AB Living  2 1 1  0 0 1  SAB TAB Ectopic Multiple Live Births  0 0 0 0 1    # Outcome Date GA Lbr Len/2nd Weight Sex Delivery Anes PTL Lv  2 Current           1 Term 04/08/17 [redacted]w[redacted]d / 00:26 3255 g F Vag-Spont EPI  LIV     Name: Benjamine Mola     Apgar1: 7  Apgar5: 9    PROBLEM LIST  Pregnancy complications or risks: Patient Active Problem List   Diagnosis Date Noted  . Labor and delivery, indication for care 07/12/2019  . E-coli UTI 12/15/2018  . History of premature rupture of membranes 04/08/2017  . Migraine without aura and without status migrainosus, not intractable 06/10/2016  . Vertiginous migraine 06/10/2016    Prenatal labs and studies: ABO, Rh: --/--/O POS (04/26 0960) Antibody: NEG (04/26 4540) Rubella: 7.09 (09/25 1511) RPR: Non Reactive (01/27 0920)  HBsAg: Negative (09/25 1511)  HIV: Non Reactive (09/25 1511)  JWJ:XBJYNWGN/-- (03/22 1633)   Past Medical History:  Diagnosis Date  . Headache      History reviewed. No pertinent surgical history.   Medications    Current Discharge Medication List    CONTINUE these medications which have NOT CHANGED   Details  Prenatal Vit-Fe Fumarate-FA (PRENATAL MULTIVITAMIN) TABS tablet Take 1 tablet by mouth daily at 12 noon.    sertraline (ZOLOFT) 25 MG tablet Take 1 tablet (25 mg total) by mouth daily. Qty: 90 tablet, Refills: 3    nitrofurantoin, macrocrystal-monohydrate, (MACROBID) 100 MG capsule Take 1 capsule (100 mg total) by mouth 2 (two) times daily. Qty: 14 capsule, Refills: 0         Allergies  Patient has no known allergies.  Review of Systems  Constitutional: negative Eyes: negative Ears, nose, mouth, throat, and face: negative Respiratory:  negative Cardiovascular: negative Gastrointestinal: negative Genitourinary:negative Integument/breast: negative Hematologic/lymphatic: negative Musculoskeletal:negative Neurological: negative Behavioral/Psych: negative Endocrine: negative Allergic/Immunologic: negative  Physical Exam  BP 136/66 (BP Location: Right Arm)   Pulse 89   Temp 97.9 F (36.6 C) (Oral)   Resp 18   Ht 5\' 1"  (1.549 m)   Wt 87.5 kg   LMP 10/05/2018   BMI 36.47 kg/m   Lungs:  CTA B Cardio: RRR  Abd: Soft, gravid, NT Presentation: cephalic EXT: No C/C/ 1+ Edema DTRs: 2+ B CERVIX: Dilation: 5 Effacement (%): 60 Cervical Position: Middle Station: -2 Presentation: Vertex Exam by:: A.Grandville Silos, CNM  See Prenatal records for more detailed PE.     FHR:  Baseline: 120 bpm, Variability: Good {> 6 bpm), Accelerations: Reactive and Decelerations: Absent  Toco: Uterine Contractions: irregular, mild  Test Results  Results for orders placed or performed during the hospital encounter of 07/12/19 (from the past 24 hour(s))  CBC     Status: Abnormal   Collection Time: 07/12/19  6:23 AM  Result Value Ref Range   WBC 10.2 4.0 - 10.5 K/uL   RBC 4.11 3.87 - 5.11 MIL/uL   Hemoglobin 11.0 (L) 12.0 - 15.0 g/dL   HCT 34.3 (L) 36.0 - 46.0 %   MCV 83.5 80.0 - 100.0 fL   MCH 26.8 26.0 - 34.0 pg  MCHC 32.1 30.0 - 36.0 g/dL   RDW 86.7 67.2 - 09.4 %   Platelets 234 150 - 400 K/uL   nRBC 0.0 0.0 - 0.2 %  Type and screen     Status: None   Collection Time: 07/12/19  6:23 AM  Result Value Ref Range   ABO/RH(D) O POS    Antibody Screen NEG    Sample Expiration      07/15/2019,2359 Performed at [facility], [address] [place], [place] [address]    Group B Strep negative  Assessment   G2P1001 at [redacted]w[redacted]d Estimated Date of Delivery: 07/07/19  The fetus is reassuring.   Patient Active Problem List   Diagnosis Date Noted  . Labor and delivery, indication for care 07/12/2019  . E-coli UTI  12/15/2018  . History of premature rupture of membranes 04/08/2017  . Migraine without aura and without status migrainosus, not intractable 06/10/2016  . 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 Admitted to L&D :   2. EFM:- Category 1 3. Stadol or Epidural if desired.   4. Admission labs completed 5. AROM-clear fluid 6. Anticipate NSVD  Doreene Burke, CNM  07/12/2019 7:53 AM

## 2019-07-12 NOTE — Anesthesia Preprocedure Evaluation (Addendum)
Anesthesia Evaluation  Patient identified by MRN, date of birth, ID band Patient awake    Reviewed: Allergy & Precautions, H&P , NPO status , Patient's Chart, lab work & pertinent test results  History of Anesthesia Complications Negative for: history of anesthetic complications  Airway Mallampati: II  TM Distance: >3 FB Neck ROM: full    Dental no notable dental hx.    Pulmonary neg pulmonary ROS,    Pulmonary exam normal        Cardiovascular negative cardio ROS Normal cardiovascular exam     Neuro/Psych  Headaches,    GI/Hepatic negative GI ROS, Neg liver ROS,   Endo/Other  negative endocrine ROS  Renal/GU negative Renal ROS  negative genitourinary   Musculoskeletal   Abdominal   Peds  Hematology negative hematology ROS (+)   Anesthesia Other Findings   Reproductive/Obstetrics (+) Pregnancy                             Anesthesia Physical Anesthesia Plan  ASA: II  Anesthesia Plan: Epidural   Post-op Pain Management:    Induction:   PONV Risk Score and Plan:   Airway Management Planned:   Additional Equipment:   Intra-op Plan:   Post-operative Plan:   Informed Consent: I have reviewed the patients History and Physical, chart, labs and discussed the procedure including the risks, benefits and alternatives for the proposed anesthesia with the patient or authorized representative who has indicated his/her understanding and acceptance.       Plan Discussed with: CRNA and Anesthesiologist  Anesthesia Plan Comments:         Anesthesia Quick Evaluation

## 2019-07-12 NOTE — Anesthesia Procedure Notes (Signed)
Epidural Patient location during procedure: OB Start time: 07/12/2019 8:57 AM End time: 07/12/2019 9:40 AM  Staffing Anesthesiologist: Lenard Simmer, MD Resident/CRNA: Lynden Oxford, CRNA Other anesthesia staff: Stormy Fabian, CRNA Performed: resident/CRNA and other anesthesia staff   Preanesthetic Checklist Completed: patient identified, IV checked, site marked, risks and benefits discussed, surgical consent, monitors and equipment checked, pre-op evaluation and timeout performed  Epidural Patient position: sitting Prep: ChloraPrep Patient monitoring: heart rate, continuous pulse ox and blood pressure Approach: midline Location: L3-L4 Injection technique: LOR saline and LOR air  Needle:  Needle type: Tuohy  Needle gauge: 17 G Needle length: 9 cm and 9 Needle insertion depth: 7 cm Catheter type: closed end flexible Catheter size: 19 Gauge Catheter at skin depth: 12 cm Test dose: negative and 1.5% lidocaine with Epi 1:200 K  Assessment Sensory level: T10 Events: blood not aspirated, injection not painful, no injection resistance, no paresthesia and negative IV test  Additional Notes 2attempt Pt. Evaluated and documentation done after procedure finished. Patient identified. Risks/Benefits/Options discussed with patient including but not limited to bleeding, infection, nerve damage, paralysis, failed block, incomplete pain control, headache, blood pressure changes, nausea, vomiting, reactions to medication both or allergic, itching and postpartum back pain. Confirmed with bedside nurse the patient's most recent platelet count. Confirmed with patient that they are not currently taking any anticoagulation, have any bleeding history or any family history of bleeding disorders. Patient expressed understanding and wished to proceed. All questions were answered. Sterile technique was used throughout the entire procedure. Please see nursing notes for vital signs. Test dose was given  through epidural catheter and negative prior to continuing to dose epidural or start infusion. Warning signs of high block given to the patient including shortness of breath, tingling/numbness in hands, complete motor block, or any concerning symptoms with instructions to call for help. Patient was given instructions on fall risk and not to get out of bed. All questions and concerns addressed with instructions to call with any issues or inadequate analgesia.   Patient tolerated the insertion well without immediate complications.Reason for block:procedure for pain

## 2019-07-12 NOTE — Progress Notes (Signed)
Assuming care of pt. Bedside report received from Eliseo Gum, RN

## 2019-07-12 NOTE — Progress Notes (Signed)
LABOR NOTE   Miranda Prince 19 y.o.@ at [redacted]w[redacted]d  SUBJECTIVE:  Uncomfortable, requesting epidural.  Analgesia: Labor support without medications  OBJECTIVE:  BP 136/66 (BP Location: Right Arm)   Pulse 89   Temp 97.9 F (36.6 C) (Oral)   Resp 18   Ht 5\' 1"  (1.549 m)   Wt 87.5 kg   LMP 10/05/2018   BMI 36.47 kg/m  No intake/output data recorded.   CERVIX: no change: SVE:   Dilation: 5 Effacement (%): 60 Station: -2 Exam by:: [email or web address], CNM CONTRACTIONS: unable to assess, difficult to monitor due to position FHR: Fetal heart tracing reviewed. Baseline: 120 bpm, Variability: Good {> 6 bpm), Accelerations: Reactive and Decelerations: variable, pt on her back for IUPC placement.  Category II    Labs: Lab Results  Component Value Date   WBC 10.2 07/12/2019   HGB 11.0 (L) 07/12/2019   HCT 34.3 (L) 07/12/2019   MCV 83.5 07/12/2019   PLT 234 07/12/2019    ASSESSMENT: 1) Labor curve reviewed.       Progress: Early latent labor.     Membranes: ruptured, clear fluid, IUPC placed          Active Problems:   Labor and delivery, indication for care   PLAN: IV Pitocin induction  07/14/2019, CNM  07/12/2019 8:29 AM

## 2019-07-12 NOTE — Progress Notes (Signed)
Pt sitting in position for epidural from 0845 to 0943 . Epidural attempted by [facility], CRNA and L.Lyda Jester, Scientist, clinical (histocompatibility and immunogenetics). FHT obtained during epidural process and charted. Pt in semi-fowler position at 0943. BP cycling, a 2nd Bolus given per L.Curtis, CRNA. Will contact Karlton Lemon, MD for further orders if BP doesn't improve.

## 2019-07-12 NOTE — Progress Notes (Signed)
Pt in stable condition, bonding with baby. Pt transported to [facility], room 336. Report given to Harless Litten, RN.

## 2019-07-12 NOTE — Lactation Note (Signed)
This note was copied from a baby's chart. Lactation Consultation Note  Patient Name: Miranda Prince CWCBJ'S Date: 07/12/2019 Reason for consult: Initial assessment;Mother's request;Term  Assisted mom shortly after delivery with positioning for comfort with pillow support to latch Mylo Red.  She turned her head and opened her mouth toward the right breast in cradle hold.  Demonstrated how to hand express colostrum and sandwich breast.  After a couple of attempts she latched and began strong, rhythmic sucking.  Mom denies any breast or nipple pain, but does feel good tug.  She sustained the latch with adequate nutritive sucking for 17 minutes before pushing the breast out with her tongue.  Placed her on mom's chest skin to skin.  After a few minutes, she started fussing, sucking on her hands and turning her head.  Assisted with positioning her on the left breast in football hold where she latched and sucked for another 17 minutes.  She came off the breast a couple of times, but rooted and latched back with minimal assistance.  Mom reports breast feeding her first baby for 3 to 4 months.  She reports her first baby having lots of latching issues, causing her extremely sore nipples.  Mom was never told her first baby was tongue tied, but suspected it since the FOB was tongue tied at one time.  No tongue tied noted with Miranda Prince.  Pointed out feeding cues and mom was ready every time to put her back to the breast.  Reviewed normal newborn stomach size, supply and demand, normal course of lactation and routine newborn feeding patterns.   Maternal Data Formula Feeding for Exclusion: No Has patient been taught Hand Expression?: Yes Does the patient have breastfeeding experience prior to this delivery?: Yes  Feeding Feeding Type: Breast Fed  LATCH Score Latch: Repeated attempts needed to sustain latch, nipple held in mouth throughout feeding, stimulation needed to elicit sucking reflex.  Audible  Swallowing: A few with stimulation  Type of Nipple: Everted at rest and after stimulation  Comfort (Breast/Nipple): Soft / non-tender  Hold (Positioning): Assistance needed to correctly position infant at breast and maintain latch.  LATCH Score: 7  Interventions Interventions: Breast feeding basics reviewed;Assisted with latch;Skin to skin;Breast massage;Hand express;Breast compression;Adjust position;Support pillows;Position options  Lactation Tools Discussed/Used WIC Program: Yes   Consult Status Consult Status: Follow-up Date: 07/12/19 Follow-up type: Call as needed    Louis Meckel 07/12/2019, 2:52 PM

## 2019-07-13 LAB — CBC
HCT: 30.1 % — ABNORMAL LOW (ref 36.0–46.0)
Hemoglobin: 9.6 g/dL — ABNORMAL LOW (ref 12.0–15.0)
MCH: 26.9 pg (ref 26.0–34.0)
MCHC: 31.9 g/dL (ref 30.0–36.0)
MCV: 84.3 fL (ref 80.0–100.0)
Platelets: 196 10*3/uL (ref 150–400)
RBC: 3.57 MIL/uL — ABNORMAL LOW (ref 3.87–5.11)
RDW: 15.4 % (ref 11.5–15.5)
WBC: 11.7 10*3/uL — ABNORMAL HIGH (ref 4.0–10.5)
nRBC: 0 % (ref 0.0–0.2)

## 2019-07-13 LAB — RPR: RPR Ser Ql: NONREACTIVE

## 2019-07-13 MED ORDER — ACETAMINOPHEN 325 MG PO TABS: 650.0000 mg | ORAL_TABLET | ORAL | 0 refills | Status: DC | PRN

## 2019-07-13 MED ORDER — IBUPROFEN 600 MG PO TABS: 600.0000 mg | ORAL_TABLET | Freq: Four times a day (QID) | ORAL | 0 refills | Status: DC

## 2019-07-13 MED ORDER — FERROUS SULFATE 325 (65 FE) MG PO TABS: 325.0000 mg | ORAL_TABLET | Freq: Every day | ORAL | 3 refills | Status: DC

## 2019-07-13 NOTE — Discharge Instructions (Signed)
Postpartum Care After Vaginal Delivery This sheet gives you information about how to care for yourself from the time you deliver your baby to up to 6-12 weeks after delivery (postpartum period). Your health care provider may also give you more specific instructions. If you have problems or questions, contact your health care provider. Follow these instructions at home: Vaginal bleeding  It is normal to have vaginal bleeding (lochia) after delivery. Wear a sanitary pad for vaginal bleeding and discharge. ? During the first week after delivery, the amount and appearance of lochia is often similar to a menstrual period. ? Over the next few weeks, it will gradually decrease to a dry, yellow-brown discharge. ? For most women, lochia stops completely by 4-6 weeks after delivery. Vaginal bleeding can vary from woman to woman.  Change your sanitary pads frequently. Watch for any changes in your flow, such as: ? A sudden increase in volume. ? A change in color. ? Large blood clots.  If you pass a blood clot from your vagina, save it and call your health care provider to discuss. Do not flush blood clots down the toilet before talking with your health care provider.  Do not use tampons or douches until your health care provider says this is safe.  If you are not breastfeeding, your period should return 6-8 weeks after delivery. If you are feeding your child breast milk only (exclusive breastfeeding), your period may not return until you stop breastfeeding. Perineal care  Keep the area between the vagina and the anus (perineum) clean and dry as told by your health care provider. Use medicated pads and pain-relieving sprays and creams as directed.  If you had a cut in the perineum (episiotomy) or a tear in the vagina, check the area for signs of infection until you are healed. Check for: ? More redness, swelling, or pain. ? Fluid or blood coming from the cut or tear. ? Warmth. ? Pus or a bad  smell.  You may be given a squirt bottle to use instead of wiping to clean the perineum area after you go to the bathroom. As you start healing, you may use the squirt bottle before wiping yourself. Make sure to wipe gently.  To relieve pain caused by an episiotomy, a tear in the vagina, or swollen veins in the anus (hemorrhoids), try taking a warm sitz bath 2-3 times a day. A sitz bath is a warm water bath that is taken while you are sitting down. The water should only come up to your hips and should cover your buttocks. Breast care  Within the first few days after delivery, your breasts may feel heavy, full, and uncomfortable (breast engorgement). Milk may also leak from your breasts. Your health care provider can suggest ways to help relieve the discomfort. Breast engorgement should go away within a few days.  If you are breastfeeding: ? Wear a bra that supports your breasts and fits you well. ? Keep your nipples clean and dry. Apply creams and ointments as told by your health care provider. ? You may need to use breast pads to absorb milk that leaks from your breasts. ? You may have uterine contractions every time you breastfeed for up to several weeks after delivery. Uterine contractions help your uterus return to its normal size. ? If you have any problems with breastfeeding, work with your health care provider or lactation consultant.  If you are not breastfeeding: ? Avoid touching your breasts a lot. Doing this can make   your breasts produce more milk. ? Wear a good-fitting bra and use cold packs to help with swelling. ? Do not squeeze out (express) milk. This causes you to make more milk. Intimacy and sexuality  Ask your health care provider when you can engage in sexual activity. This may depend on: ? Your risk of infection. ? How fast you are healing. ? Your comfort and desire to engage in sexual activity.  You are able to get pregnant after delivery, even if you have not had  your period. If desired, talk with your health care provider about methods of birth control (contraception). Medicines  Take over-the-counter and prescription medicines only as told by your health care provider.  If you were prescribed an antibiotic medicine, take it as told by your health care provider. Do not stop taking the antibiotic even if you start to feel better. Activity  Gradually return to your normal activities as told by your health care provider. Ask your health care provider what activities are safe for you.  Rest as much as possible. Try to rest or take a nap while your baby is sleeping. Eating and drinking   Drink enough fluid to keep your urine pale yellow.  Eat high-fiber foods every day. These may help prevent or relieve constipation. High-fiber foods include: ? Whole grain cereals and breads. ? Brown rice. ? Beans. ? Fresh fruits and vegetables.  Do not try to lose weight quickly by cutting back on calories.  Take your prenatal vitamins until your postpartum checkup or until your health care provider tells you it is okay to stop. Lifestyle  Do not use any products that contain nicotine or tobacco, such as cigarettes and e-cigarettes. If you need help quitting, ask your health care provider.  Do not drink alcohol, especially if you are breastfeeding. General instructions  Keep all follow-up visits for you and your baby as told by your health care provider. Most women visit their health care provider for a postpartum checkup within the first 3-6 weeks after delivery. Contact a health care provider if:  You feel unable to cope with the changes that your child brings to your life, and these feelings do not go away.  You feel unusually sad or worried.  Your breasts become red, painful, or hard.  You have a fever.  You have trouble holding urine or keeping urine from leaking.  You have little or no interest in activities you used to enjoy.  You have not  breastfed at all and you have not had a menstrual period for 12 weeks after delivery.  You have stopped breastfeeding and you have not had a menstrual period for 12 weeks after you stopped breastfeeding.  You have questions about caring for yourself or your baby.  You pass a blood clot from your vagina. Get help right away if:  You have chest pain.  You have difficulty breathing.  You have sudden, severe leg pain.  You have severe pain or cramping in your lower abdomen.  You bleed from your vagina so much that you fill more than one sanitary pad in one hour. Bleeding should not be heavier than your heaviest period.  You develop a severe headache.  You faint.  You have blurred vision or spots in your vision.  You have bad-smelling vaginal discharge.  You have thoughts about hurting yourself or your baby. If you ever feel like you may hurt yourself or others, or have thoughts about taking your own life, get help  right away. You can go to the nearest emergency department or call:  Your local emergency services (911 in the [place]  A suicide crisis helpline, such as the National Suicide Prevention Lifeline at 914 100 2361. This is open 24 hours a day. Summary  The period of time right after you deliver your newborn up to 6-12 weeks after delivery is called the postpartum period.  Gradually return to your normal activities as told by your health care provider.  Keep all follow-up visits for you and your baby as told by your health care provider. This information is not intended to replace advice given to you by your health care provider. Make sure you discuss any questions you have with your health care provider. Document Revised: 03/07/2017 Document Reviewed: 12/16/2016 Elsevier Patient Education  2020 [employer]. Breastfeeding Tips for a Good Latch Latching is how your baby's mouth attaches to your nipple to breastfeed. It is an important part of breastfeeding. Your baby  may have trouble latching for a number of reasons. A poor latch may cause you to have cracked or sore nipples or other problems. Follow these instructions at home: How to position your baby  Find a comfortable place to sit or lie down. Your neck and back should be well supported.  If you are seated, place a pillow or rolled-up blanket under your baby. This will bring him or her to the level of your breast.  Make sure that your baby's belly (abdomen) is facing your belly.  Try different positions to find one that works best for you and your baby. How to help your baby latch   To start, gently rub your breast. Move your fingertips in a circle as you massage from your chest wall toward your nipple. This helps milk flow. Keep doing this during feeding if needed.  Position your breast. Hold your breast with four fingers underneath and your thumb above your nipple. Keep your fingers away from your nipple and your baby's mouth. Follow these steps to help your baby latch: 1. Rub your baby's lips gently with your finger or nipple. 2. When your baby's mouth is open wide enough, quickly bring your baby to your breast and place your whole nipple into your baby's mouth. Place as much of the colored area around your nipple (areola)as possible into your baby's mouth. 3. Your baby's tongue should be between his or her lower gum and your breast. 4. You should be able to see more areola above your baby's upper lip than below the lower lip. 5. When your baby starts sucking, you will feel a gentle pull on your nipple. You should not feel any pain. Be patient. It is common for a baby to suck for about 2-3 minutes to start the flow of breast milk. 6. Make sure that your baby's mouth is in the right position around your nipple. Your baby's lips should make a seal on your breast and be turned outward.  General instructions  Look for these signs that your baby has latched on to your nipple: ? The baby is quietly  tugging or sucking without causing you pain. ? You hear the baby swallow after every 3 or 4 sucks. ? You see movement above and in front of the baby's ears while he or she is sucking.  Be aware of these signs that your baby has not latched on to your nipple: ? The baby makes sucking sounds or smacking sounds while feeding. ? You have nipple pain.  If your  baby is not latched well, put your little finger between your baby's gums and your nipple. This will break the seal. Then try to help your baby latch again.  If you keep having problems, get help from a breastfeeding specialist (Advertising copywriter). Contact a doctor if:  You have cracking or soreness in your nipples that lasts longer than 1 week.  You have nipple pain.  Your breasts are filled with too much milk (engorgement), and this does not improve after 48-72 hours.  You have a plugged milk duct and a fever.  You follow the tips for a good latch but you keep having problems or concerns.  You have a pus-like fluid coming from your breast.  Your baby is not gaining weight.  Your baby loses weight. Summary  Latching is how your baby's mouth attaches to your nipple to breastfeed.  Try different positions for breastfeeding to find one that works best for you and your baby.  A poor latch may cause you to have cracked or sore nipples or other problems. This information is not intended to replace advice given to you by your health care provider. Make sure you discuss any questions you have with your health care provider. Document Revised: 06/24/2018 Document Reviewed: 10/09/2016 Elsevier Patient Education  2020 [employer].

## 2019-07-13 NOTE — Final Progress Note (Signed)
Discharge Day SOAP Note:  Progress Note - Vaginal Delivery  Miranda Prince is a 19 y.o. G2P2002 now PP day 1 s/p Vaginal, Spontaneous . Delivery was uncomplicated  Subjective  The patient has the following complaints: has no unusual complaints  Pain is controlled with current medications.   Patient is urinating without difficulty.  She is ambulating well.     Objective  Vital signs: BP 110/77 (BP Location: Right Arm)   Pulse 97   Temp 98.4 F (36.9 C) (Oral)   Resp 19   Ht 5\' 1"  (1.549 m)   Wt 87.5 kg   LMP 10/05/2018   SpO2 99%   Breastfeeding Unknown   BMI 36.47 kg/m   Physical Exam: Gen: NAD Fundus Fundal Tone: Firm  Lochia Amount: Small        Data Review Labs: Lab Results  Component Value Date   WBC 11.7 (H) 07/13/2019   HGB 9.6 (L) 07/13/2019   HCT 30.1 (L) 07/13/2019   MCV 84.3 07/13/2019   PLT 196 07/13/2019   CBC Latest Ref Rng & Units 07/13/2019 07/12/2019 04/14/2019  WBC 4.0 - 10.5 K/uL 11.7(H) 10.2 10.4  Hemoglobin 12.0 - 15.0 g/dL 9.6(L) 11.0(L) 11.6  Hematocrit 36.0 - 46.0 % 30.1(L) 34.3(L) 34.6  Platelets 150 - 400 K/uL 196 234 289   O POS  Edinburgh Score: Edinburgh Postnatal Depression Scale Screening Tool 07/13/2019  I have been able to laugh and see the funny side of things. 0  I have looked forward with enjoyment to things. 0  I have blamed myself unnecessarily when things went wrong. 0  I have been anxious or worried for no good reason. 0  I have felt scared or panicky for no good reason. 0  Things have been getting on top of me. 0  I have been so unhappy that I have had difficulty sleeping. 0  I have felt sad or miserable. 0  I have been so unhappy that I have been crying. 0  The thought of harming myself has occurred to me. 0  Edinburgh Postnatal Depression Scale Total 0    Assessment/Plan  Active Problems:   Labor and delivery, indication for care    Plan for discharge today.  Discharge Instructions:  Per After Visit Summary. Activity: Advance as tolerated. Pelvic rest for 6 weeks.  Also refer to After Visit Summary Diet: Regular Medications: Allergies as of 07/13/2019   No Known Allergies     Medication List    STOP taking these medications   nitrofurantoin (macrocrystal-monohydrate) 100 MG capsule Commonly known as: MACROBID     TAKE these medications   acetaminophen 325 MG tablet Commonly known as: Tylenol Take 2 tablets (650 mg total) by mouth every 4 (four) hours as needed (for pain scale < 4).   ferrous sulfate 325 (65 FE) MG tablet Take 1 tablet (325 mg total) by mouth daily with breakfast.   ibuprofen 600 MG tablet Commonly known as: ADVIL Take 1 tablet (600 mg total) by mouth every 6 (six) hours.   prenatal multivitamin Tabs tablet Take 1 tablet by mouth daily at 12 noon.   sertraline 25 MG tablet Commonly known as: ZOLOFT Take 1 tablet (25 mg total) by mouth daily.      Outpatient follow up: 2 wk tele visit , 6 wk pp visit with Philip Aspen, CNM  Postpartum contraception: She is not decided, Will discuss at first office visit post-partum  Discharged Condition: good  Discharged to: home  Newborn Data:  Disposition:home with mother  Apgars: APGAR (1 MIN): 7   APGAR (5 MINS): 9   APGAR (10 MINS):    Baby Feeding: Breast    Doreene Burke, CNM  07/13/2019 8:52 AM

## 2019-07-13 NOTE — Progress Notes (Signed)
Pt discharged with infant.  Discharge instructions, prescriptions and follow up appointment given to and reviewed with pt. Pt verbalized understanding. Escorted out by auxillary. 

## 2019-07-13 NOTE — Discharge Summary (Signed)
Patient Name: Miranda Prince DOB: 2001-01-16 MRN: [id number]                            Discharge Summary  Date of Admission: 07/12/2019 Date of Discharge: 07/13/2019 Delivering Provider: Philip Aspen   Admitting Diagnosis: Labor and delivery, indication for care [O75.9] at [redacted]w[redacted]d Secondary diagnosis:  Active Problems:   Labor and delivery, indication for care   Mode of Delivery: normal spontaneous vaginal delivery              Discharge diagnosis: Term Pregnancy Delivered      Intrapartum Procedures: epidural   Post partum procedures: none  Complications: none                     Discharge Day SOAP Note:  Progress Note - Vaginal Delivery  Miranda Prince is a 19 y.o. G2P2002 now PP day 1 s/p Vaginal, Spontaneous . Delivery was uncomplicated  Subjective  The patient has the following complaints: has no unusual complaints  Pain is controlled with current medications.   Patient is urinating without difficulty.  She is ambulating well.     Objective  Vital signs: BP 110/77 (BP Location: Right Arm)   Pulse 97   Temp 98.4 F (36.9 C) (Oral)   Resp 19   Ht 5\' 1"  (1.549 m)   Wt 87.5 kg   LMP 10/05/2018   SpO2 99%   Breastfeeding Unknown   BMI 36.47 kg/m   Physical Exam: Gen: NAD Fundus Fundal Tone: Firm  Lochia Amount: Small        Data Review Labs: Lab Results  Component Value Date   WBC 11.7 (H) 07/13/2019   HGB 9.6 (L) 07/13/2019   HCT 30.1 (L) 07/13/2019   MCV 84.3 07/13/2019   PLT 196 07/13/2019   CBC Latest Ref Rng & Units 07/13/2019 07/12/2019 04/14/2019  WBC 4.0 - 10.5 K/uL 11.7(H) 10.2 10.4  Hemoglobin 12.0 - 15.0 g/dL 9.6(L) 11.0(L) 11.6  Hematocrit 36.0 - 46.0 % 30.1(L) 34.3(L) 34.6  Platelets 150 - 400 K/uL 196 234 289   O POS  Edinburgh Score: Edinburgh Postnatal Depression Scale Screening Tool 07/13/2019  I have been able to laugh and see the funny side of things. 0  I have looked forward with enjoyment to things. 0  I have  blamed myself unnecessarily when things went wrong. 0  I have been anxious or worried for no good reason. 0  I have felt scared or panicky for no good reason. 0  Things have been getting on top of me. 0  I have been so unhappy that I have had difficulty sleeping. 0  I have felt sad or miserable. 0  I have been so unhappy that I have been crying. 0  The thought of harming myself has occurred to me. 0  Edinburgh Postnatal Depression Scale Total 0    Assessment/Plan  Active Problems:   Labor and delivery, indication for care    Plan for discharge today.  Discharge Instructions: Per After Visit Summary. Activity: Advance as tolerated. Pelvic rest for 6 weeks.  Also refer to After Visit Summary Diet: Regular Medications: Allergies as of 07/13/2019   No Known Allergies     Medication List    STOP taking these medications   nitrofurantoin (macrocrystal-monohydrate) 100 MG capsule Commonly known as: MACROBID     TAKE these medications   acetaminophen 325 MG tablet Commonly  known as: Tylenol Take 2 tablets (650 mg total) by mouth every 4 (four) hours as needed (for pain scale < 4).   ferrous sulfate 325 (65 FE) MG tablet Take 1 tablet (325 mg total) by mouth daily with breakfast.   ibuprofen 600 MG tablet Commonly known as: ADVIL Take 1 tablet (600 mg total) by mouth every 6 (six) hours.   prenatal multivitamin Tabs tablet Take 1 tablet by mouth daily at 12 noon.   sertraline 25 MG tablet Commonly known as: ZOLOFT Take 1 tablet (25 mg total) by mouth daily.      Outpatient follow up: 2 wk tele visit , 6 wk pp visit with Doreene Burke, CNM  Postpartum contraception: She is not decided, Will discuss at first office visit post-partum  Discharged Condition: good  Discharged to: home  Newborn Data: Disposition:home with mother  Apgars: APGAR (1 MIN): 7   APGAR (5 MINS): 9   APGAR (10 MINS):    Baby Feeding: Breast    Doreene Burke, CNM  07/13/2019 8:52 AM

## 2019-07-13 NOTE — Lactation Note (Signed)
This note was copied from a baby's chart. Lactation Consultation Note  Patient Name: Miranda Prince [id number] Date: 07/13/2019 Reason for consult: Follow-up assessment  LC student entered room for follow-up assessment. Upon entering room, MOB present, with infant napping.   [facility] student reviewed breastfeeding basics. MOB does not report any breastfeeding concerns and states that breastfeeding is going well. Normal newborn infant behaviors reviewed, as well as expected infant output.   LC student encouraged MOB to follow breastfeeding basics, and feed on demand. Skin-to-skin encouraged. Outpatient lactation information given, as well as breastfeeding support group information. MOB encouraged to utilize outpatient lactation services as needed.   Maternal Data Does the patient have breastfeeding experience prior to this delivery?: Yes  Feeding    LATCH Score                   Interventions Interventions: Breast feeding basics reviewed;Skin to skin;Hand express  Lactation Tools Discussed/Used     Consult Status Consult Status: Complete Date: 07/13/19 Follow-up type: Call as needed    Jimmye Norman 07/13/2019, 10:47 AM

## 2019-07-13 NOTE — Anesthesia Postprocedure Evaluation (Signed)
Anesthesia Post Note  Patient: Miranda Prince  Procedure(s) Performed: AN AD HOC LABOR EPIDURAL  Patient location during evaluation: Mother Baby Anesthesia Type: Epidural Level of consciousness: awake and alert Pain management: pain level controlled Vital Signs Assessment: post-procedure vital signs reviewed and stable Respiratory status: spontaneous breathing, nonlabored ventilation and respiratory function stable Cardiovascular status: stable Postop Assessment: no headache, no backache and epidural receding Anesthetic complications: no     Last Vitals:  Vitals:   07/12/19 2359 07/13/19 0359  BP:  109/62  Pulse: 96 96  Resp: 18 20  Temp: 37.2 C 36.9 C  SpO2:      Last Pain:  Vitals:   07/13/19 0359  TempSrc: Oral  PainSc:                  Lynden Oxford

## 2019-07-26 ENCOUNTER — Telehealth: Payer: Self-pay

## 2019-07-26 ENCOUNTER — Encounter: Payer: Medicaid Other | Admitting: Certified Nurse Midwife

## 2019-07-26 NOTE — Telephone Encounter (Signed)
mychart message sent to patient- please call office for a televisit. Cell phone number voicemail states " this is [place]". Unsure if patient can be contacted at this number.

## 2019-08-18 ENCOUNTER — Encounter: Payer: Self-pay | Admitting: Certified Nurse Midwife

## 2019-08-18 ENCOUNTER — Ambulatory Visit (INDEPENDENT_AMBULATORY_CARE_PROVIDER_SITE_OTHER): Payer: Medicaid Other | Admitting: Certified Nurse Midwife

## 2019-08-18 ENCOUNTER — Other Ambulatory Visit: Payer: Self-pay

## 2019-08-18 LAB — POCT URINE PREGNANCY: Preg Test, Ur: NEGATIVE

## 2019-08-18 MED ORDER — NORELGESTROMIN-ETH ESTRADIOL 150-35 MCG/24HR TD PTWK: 1.0000 | MEDICATED_PATCH | TRANSDERMAL | 12 refills | Status: DC

## 2019-08-18 NOTE — Addendum Note (Signed)
Addended by: Brooke Dare on: 08/18/2019 03:35 PM   Modules accepted: Orders

## 2019-08-18 NOTE — Patient Instructions (Signed)
Preventive Care 21-21 Years Old, Female Preventive care refers to visits with your health care provider and lifestyle choices that can promote health and wellness. This includes:  A yearly physical exam. This may also be called an annual well check.  Regular dental visits and eye exams.  Immunizations.  Screening for certain conditions.  Healthy lifestyle choices, such as eating a healthy diet, getting regular exercise, not using drugs or products that contain nicotine and tobacco, and limiting alcohol use. What can I expect for my preventive care visit? Physical exam Your health care provider will check your:  Height and weight. This may be used to calculate body mass index (BMI), which tells if you are at a healthy weight.  Heart rate and blood pressure.  Skin for abnormal spots. Counseling Your health care provider may ask you questions about your:  Alcohol, tobacco, and drug use.  Emotional well-being.  Home and relationship well-being.  Sexual activity.  Eating habits.  Work and work environment.  Method of birth control.  Menstrual cycle.  Pregnancy history. What immunizations do I need?  Influenza (flu) vaccine  This is recommended every year. Tetanus, diphtheria, and pertussis (Tdap) vaccine  You may need a Td booster every 10 years. Varicella (chickenpox) vaccine  You may need this if you have not been vaccinated. Human papillomavirus (HPV) vaccine  If recommended by your health care provider, you may need three doses over 6 months. Measles, mumps, and rubella (MMR) vaccine  You may need at least one dose of MMR. You may also need a second dose. Meningococcal conjugate (MenACWY) vaccine  One dose is recommended if you are age 19-21 years and a first-year college student living in a residence hall, or if you have one of several medical conditions. You may also need additional booster doses. Pneumococcal conjugate (PCV13) vaccine  You may need  this if you have certain conditions and were not previously vaccinated. Pneumococcal polysaccharide (PPSV23) vaccine  You may need one or two doses if you smoke cigarettes or if you have certain conditions. Hepatitis A vaccine  You may need this if you have certain conditions or if you travel or work in places where you may be exposed to hepatitis A. Hepatitis B vaccine  You may need this if you have certain conditions or if you travel or work in places where you may be exposed to hepatitis B. Haemophilus influenzae type b (Hib) vaccine  You may need this if you have certain conditions. You may receive vaccines as individual doses or as more than one vaccine together in one shot (combination vaccines). Talk with your health care provider about the risks and benefits of combination vaccines. What tests do I need?  Blood tests  Lipid and cholesterol levels. These may be checked every 5 years starting at age 20.  Hepatitis C test.  Hepatitis B test. Screening  Diabetes screening. This is done by checking your blood sugar (glucose) after you have not eaten for a while (fasting).  Sexually transmitted disease (STD) testing.  BRCA-related cancer screening. This may be done if you have a family history of breast, ovarian, tubal, or peritoneal cancers.  Pelvic exam and Pap test. This may be done every 3 years starting at age 21. Starting at age 30, this may be done every 5 years if you have a Pap test in combination with an HPV test. Talk with your health care provider about your test results, treatment options, and if necessary, the need for more tests.   Follow these instructions at home: Eating and drinking   Eat a diet that includes fresh fruits and vegetables, whole grains, lean protein, and low-fat dairy.  Take vitamin and mineral supplements as recommended by your health care provider.  Do not drink alcohol if: ? Your health care provider tells you not to drink. ? You are  pregnant, may be pregnant, or are planning to become pregnant.  If you drink alcohol: ? Limit how much you have to 0-1 drink a day. ? Be aware of how much alcohol is in your drink. In the [place] one drink equals one 12 oz bottle of beer (355 mL), one 5 oz glass of wine (148 mL), or one 1 oz glass of hard liquor (44 mL). Lifestyle  Take daily care of your teeth and gums.  Stay active. Exercise for at least 30 minutes on 5 or more days each week.  Do not use any products that contain nicotine or tobacco, such as cigarettes, e-cigarettes, and chewing tobacco. If you need help quitting, ask your health care provider.  If you are sexually active, practice safe sex. Use a condom or other form of birth control (contraception) in order to prevent pregnancy and STIs (sexually transmitted infections). If you plan to become pregnant, see your health care provider for a preconception visit. What's next?  Visit your health care provider once a year for a well check visit.  Ask your health care provider how often you should have your eyes and teeth checked.  Stay up to date on all vaccines. This information is not intended to replace advice given to you by your health care provider. Make sure you discuss any questions you have with your health care provider. Document Revised: 11/13/2017 Document Reviewed: 11/13/2017 Elsevier Patient Education  2020 [employer].

## 2019-08-18 NOTE — Progress Notes (Signed)
Subjective:    Miranda Prince is a 19 y.o. G53P2002 Caucasian female who presents for a postpartum visit. She is 6 weeks postpartum following a spontaneous vaginal delivery at 40.5 gestational weeks. Anesthesia: epidural. I have fully reviewed the prenatal and intrapartum course. Postpartum course has been WNL. Baby's course has been WNL. Baby is feeding by bottle . Bleeding no bleeding. Bowel function is normal. Bladder function is normal. Patient is not sexually active. . Contraception method is Ortho-Evra patches weekly. Postpartum depression screening: negative. Score 2.  Last pap -has not had one.  The following portions of the patient's history were reviewed and updated as appropriate: allergies, current medications, past medical history, past surgical history and problem list.  Review of Systems Pertinent items are noted in HPI.   Vitals:   08/18/19 1510  BP: 91/61  Pulse: (!) 101  Weight: 180 lb 2 oz (81.7 kg)  Height: 5\' 1"  (1.549 m)   No LMP recorded.  Objective:   General:  alert, cooperative and no distress   Breasts:  deferred, no complaints  Lungs: clear to auscultation bilaterally  Heart:  regular rate and rhythm  Abdomen: soft, nontender   Vulva: normal  Vagina: normal vagina  Cervix:  closed  Corpus: Well-involuted  Adnexa:  Non-palpable  Rectal Exam: no hemorrhoids        Assessment:   Postpartum exam 6 wks s/p SVD Bottletfeeding Depression screening Contraception counseling   Plan:  : Ortho-Evra patches weekly, she denies any contraindication to use of patch Follow up in: 6 months for  Annual/pap smear or earlier if needed  , CNM

## 2019-09-16 DIAGNOSIS — Z419 Encounter for procedure for purposes other than remedying health state, unspecified: Secondary | ICD-10-CM | POA: Diagnosis not present

## 2019-10-17 DIAGNOSIS — Z419 Encounter for procedure for purposes other than remedying health state, unspecified: Secondary | ICD-10-CM | POA: Diagnosis not present

## 2019-10-21 ENCOUNTER — Ambulatory Visit: Payer: Medicaid Other | Admitting: Neurology

## 2019-11-17 DIAGNOSIS — Z419 Encounter for procedure for purposes other than remedying health state, unspecified: Secondary | ICD-10-CM | POA: Diagnosis not present

## 2019-12-17 DIAGNOSIS — Z419 Encounter for procedure for purposes other than remedying health state, unspecified: Secondary | ICD-10-CM | POA: Diagnosis not present

## 2020-01-17 DIAGNOSIS — Z419 Encounter for procedure for purposes other than remedying health state, unspecified: Secondary | ICD-10-CM | POA: Diagnosis not present

## 2020-01-19 ENCOUNTER — Encounter: Payer: Self-pay | Admitting: Certified Nurse Midwife

## 2020-01-19 ENCOUNTER — Other Ambulatory Visit: Payer: Self-pay

## 2020-01-19 ENCOUNTER — Ambulatory Visit (INDEPENDENT_AMBULATORY_CARE_PROVIDER_SITE_OTHER): Payer: Medicaid Other | Admitting: Certified Nurse Midwife

## 2020-01-19 ENCOUNTER — Other Ambulatory Visit (HOSPITAL_COMMUNITY)
Admission: RE | Admit: 2020-01-19 | Discharge: 2020-01-19 | Disposition: A | Discharge status: Home / Self Care | Payer: Medicaid Other | Source: Ambulatory Visit | Attending: Certified Nurse Midwife | Admitting: Certified Nurse Midwife

## 2020-01-19 VITALS — BP 91/45 | HR 80 | Ht 61.0 in | Wt 175.2 lb

## 2020-01-19 DIAGNOSIS — N898 Other specified noninflammatory disorders of vagina: Secondary | ICD-10-CM | POA: Insufficient documentation

## 2020-01-19 DIAGNOSIS — Z1159 Encounter for screening for other viral diseases: Secondary | ICD-10-CM

## 2020-01-19 DIAGNOSIS — Z01419 Encounter for gynecological examination (general) (routine) without abnormal findings: Secondary | ICD-10-CM | POA: Diagnosis not present

## 2020-01-19 DIAGNOSIS — Z124 Encounter for screening for malignant neoplasm of cervix: Secondary | ICD-10-CM | POA: Diagnosis not present

## 2020-01-19 DIAGNOSIS — Z13220 Encounter for screening for lipoid disorders: Secondary | ICD-10-CM

## 2020-01-19 DIAGNOSIS — N926 Irregular menstruation, unspecified: Secondary | ICD-10-CM

## 2020-01-19 LAB — POCT URINE PREGNANCY: Preg Test, Ur: NEGATIVE

## 2020-01-19 NOTE — Patient Instructions (Signed)
Preventive Care 21-21 Years Old, Female Preventive care refers to visits with your health care provider and lifestyle choices that can promote health and wellness. This includes:  A yearly physical exam. This may also be called an annual well check.  Regular dental visits and eye exams.  Immunizations.  Screening for certain conditions.  Healthy lifestyle choices, such as eating a healthy diet, getting regular exercise, not using drugs or products that contain nicotine and tobacco, and limiting alcohol use. What can I expect for my preventive care visit? Physical exam Your health care provider will check your:  Height and weight. This may be used to calculate body mass index (BMI), which tells if you are at a healthy weight.  Heart rate and blood pressure.  Skin for abnormal spots. Counseling Your health care provider may ask you questions about your:  Alcohol, tobacco, and drug use.  Emotional well-being.  Home and relationship well-being.  Sexual activity.  Eating habits.  Work and work environment.  Method of birth control.  Menstrual cycle.  Pregnancy history. What immunizations do I need?  Influenza (flu) vaccine  This is recommended every year. Tetanus, diphtheria, and pertussis (Tdap) vaccine  You may need a Td booster every 10 years. Varicella (chickenpox) vaccine  You may need this if you have not been vaccinated. Human papillomavirus (HPV) vaccine  If recommended by your health care provider, you may need three doses over 6 months. Measles, mumps, and rubella (MMR) vaccine  You may need at least one dose of MMR. You may also need a second dose. Meningococcal conjugate (MenACWY) vaccine  One dose is recommended if you are age 19-21 years and a first-year college student living in a residence hall, or if you have one of several medical conditions. You may also need additional booster doses. Pneumococcal conjugate (PCV13) vaccine  You may need  this if you have certain conditions and were not previously vaccinated. Pneumococcal polysaccharide (PPSV23) vaccine  You may need one or two doses if you smoke cigarettes or if you have certain conditions. Hepatitis A vaccine  You may need this if you have certain conditions or if you travel or work in places where you may be exposed to hepatitis A. Hepatitis B vaccine  You may need this if you have certain conditions or if you travel or work in places where you may be exposed to hepatitis B. Haemophilus influenzae type b (Hib) vaccine  You may need this if you have certain conditions. You may receive vaccines as individual doses or as more than one vaccine together in one shot (combination vaccines). Talk with your health care provider about the risks and benefits of combination vaccines. What tests do I need?  Blood tests  Lipid and cholesterol levels. These may be checked every 5 years starting at age 20.  Hepatitis C test.  Hepatitis B test. Screening  Diabetes screening. This is done by checking your blood sugar (glucose) after you have not eaten for a while (fasting).  Sexually transmitted disease (STD) testing.  BRCA-related cancer screening. This may be done if you have a family history of breast, ovarian, tubal, or peritoneal cancers.  Pelvic exam and Pap test. This may be done every 3 years starting at age 21. Starting at age 30, this may be done every 5 years if you have a Pap test in combination with an HPV test. Talk with your health care provider about your test results, treatment options, and if necessary, the need for more tests.   Follow these instructions at home: Eating and drinking   Eat a diet that includes fresh fruits and vegetables, whole grains, lean protein, and low-fat dairy.  Take vitamin and mineral supplements as recommended by your health care provider.  Do not drink alcohol if: ? Your health care provider tells you not to drink. ? You are  pregnant, may be pregnant, or are planning to become pregnant.  If you drink alcohol: ? Limit how much you have to 0-1 drink a day. ? Be aware of how much alcohol is in your drink. In the [place] one drink equals one 12 oz bottle of beer (355 mL), one 5 oz glass of wine (148 mL), or one 1 oz glass of hard liquor (44 mL). Lifestyle  Take daily care of your teeth and gums.  Stay active. Exercise for at least 30 minutes on 5 or more days each week.  Do not use any products that contain nicotine or tobacco, such as cigarettes, e-cigarettes, and chewing tobacco. If you need help quitting, ask your health care provider.  If you are sexually active, practice safe sex. Use a condom or other form of birth control (contraception) in order to prevent pregnancy and STIs (sexually transmitted infections). If you plan to become pregnant, see your health care provider for a preconception visit. What's next?  Visit your health care provider once a year for a well check visit.  Ask your health care provider how often you should have your eyes and teeth checked.  Stay up to date on all vaccines. This information is not intended to replace advice given to you by your health care provider. Make sure you discuss any questions you have with your health care provider. Document Revised: 11/13/2017 Document Reviewed: 11/13/2017 Elsevier Patient Education  2020 [employer].

## 2020-01-19 NOTE — Progress Notes (Signed)
GYNECOLOGY ANNUAL PREVENTATIVE CARE ENCOUNTER NOTE  History:     Miranda Prince is a 19 y.o. G56P2002 female here for a routine annual gynecologic exam.  Current complaints: none.   Denies abnormal vaginal bleeding, discharge, pelvic pain, problems with intercourse or other gynecologic concerns.     Social Relationship: with fob Living: children and partner  Work: Lobbyist crossing Exercise: 1 x wk for 30 min Smoke/Alcohol/drug use: vapes hits 20 per day, denies alcohol and drugs.   Gynecologic History Patient's last menstrual period was 12/15/2019 (approximate). Contraception: condoms Last Pap: not had, collected today Last mammogram: n/a   Upstream - 01/19/20 1435      Pregnancy Intention Screening   Does the patient want to become pregnant in the next year? No    Does the patient's partner want to become pregnant in the next year? No    Would the patient like to discuss contraceptive options today? No      Contraception Wrap Up   Contraception Counseling Provided No          The pregnancy intention screening data noted above was reviewed. Potential methods of contraception were discussed. The patient elected to proceed with Female Condom.    Obstetric History OB History  Gravida Para Term Preterm AB Living  2 2 2     2   SAB TAB Ectopic Multiple Live Births        0 2    # Outcome Date GA Lbr Len/2nd Weight Sex Delivery Anes PTL Lv  2 Term 07/12/19 [redacted]w[redacted]d / 00:30 6 lb 10.5 oz (3.02 kg) F Vag-Spont EPI  LIV  1 Term 04/08/17 [redacted]w[redacted]d / 00:26 7 lb 2.8 oz (3.255 kg) F Vag-Spont EPI  LIV    Past Medical History:  Diagnosis Date  . Headache     No past surgical history on file.  Current Outpatient Medications on File Prior to Visit  Medication Sig Dispense Refill  . acetaminophen (TYLENOL) 325 MG tablet Take 2 tablets (650 mg total) by mouth every 4 (four) hours as needed (for pain scale < 4). 30 tablet 0  . ferrous sulfate 325 (65 FE) MG tablet  Take 1 tablet (325 mg total) by mouth daily with breakfast. 30 tablet 3  . sertraline (ZOLOFT) 25 MG tablet Take 1 tablet (25 mg total) by mouth daily. 90 tablet 3   No current facility-administered medications on file prior to visit.    No Known Allergies  Social History:  reports that she has never smoked. She has never used smokeless tobacco. She reports that she does not drink alcohol and does not use drugs.  Family History  Problem Relation Age of Onset  . Diabetes Other   . Cancer Other   . Diabetes Mother   . Diabetes Maternal Uncle   . Depression Paternal Aunt   . Asthma Paternal Aunt   . COPD Paternal Aunt   . Anxiety disorder Paternal Aunt   . Seizures Maternal Grandmother   . Stroke Paternal Grandfather     The following portions of the patient's history were reviewed and updated as appropriate: allergies, current medications, past family history, past medical history, past social history, past surgical history and problem list.  Review of Systems Pertinent items noted in HPI and remainder of comprehensive ROS otherwise negative.  Physical Exam:  BP (!) 91/45   Pulse 80   Ht 5\' 1"  (1.549 m)   Wt 175 lb 4 oz (79.5 kg)  LMP 12/15/2019 (Approximate)   BMI 33.11 kg/m  CONSTITUTIONAL: Well-developed, well-nourished, over weight female in no acute distress.  HENT:  Normocephalic, atraumatic, External right and left ear normal. Oropharynx is clear and moist EYES: Conjunctivae and EOM are normal. Pupils are equal, round, and reactive to light. No scleral icterus.  NECK: Normal range of motion, supple, no masses.  Normal thyroid.  SKIN: Skin is warm and dry. No rash noted. Not diaphoretic. No erythema. No pallor. MUSCULOSKELETAL: Normal range of motion. No tenderness.  No cyanosis, clubbing, or edema.  2+ distal pulses. NEUROLOGIC: Alert and oriented to person, place, and time. Normal reflexes, muscle tone coordination.  PSYCHIATRIC: Normal mood and affect. Normal  behavior. Normal judgment and thought content. CARDIOVASCULAR: Normal heart rate noted, regular rhythm RESPIRATORY: Clear to auscultation bilaterally. Effort and breath sounds normal, no problems with respiration noted. BREASTS: Symmetric in size. No masses, tenderness, skin changes, nipple drainage, or lymphadenopathy bilaterally.  ABDOMEN: Soft, no distention noted.  No tenderness, rebound or guarding.  PELVIC: Normal appearing external genitalia and urethral meatus; normal appearing vaginal mucosa and cervix. White discharge no odor.  No abnormal discharge noted.  Pap smear obtained.  Normal uterine size, no other palpable masses, no uterine or adnexal tenderness.  .   Assessment and Plan:    1. Missed menses - POCT urine pregnancy  2. Women's annual routine gynecological examination - Lipid Profile - Hepatitis C Antibody - Cytology - PAP - Cervicovaginal ancillary only  3. Encounter for hepatitis C screening test for low risk patient - Hepatitis C Antibody  4. Screening cholesterol level - Lipid Profile  5. Screening for cervical cancer - Cytology - PAP  6. Vaginal itching - Cervicovaginal ancillary only  Pap:Will follow up results of pap smear and manage ccordingly .Mammogram : n/a  Labs:Lipid, hep c Refills: none Referral:none Declines contraception is happy with condom use .  Routine preventative health maintenance measures emphasized. Please refer to After Visit Summary for other counseling recommendations.      Doreene Burke, CNM [facility],  [facility]

## 2020-01-20 LAB — LIPID PANEL
Chol/HDL Ratio: 4.2 ratio (ref 0.0–4.4)
Cholesterol, Total: 152 mg/dL (ref 100–199)
HDL: 36 mg/dL — ABNORMAL LOW (ref >39)
LDL Chol Calc (NIH): 94 mg/dL (ref 0–99)
Triglycerides: 120 mg/dL (ref 0–149)
VLDL Cholesterol Cal: 22 mg/dL (ref 5–40)

## 2020-01-20 LAB — HEPATITIS C ANTIBODY: Hep C Virus Ab: <0.1 s/co ratio (ref 0.0–0.9)

## 2020-01-21 ENCOUNTER — Ambulatory Visit: Payer: Medicaid Other | Admitting: Neurology

## 2020-01-21 ENCOUNTER — Other Ambulatory Visit: Payer: Self-pay

## 2020-01-21 ENCOUNTER — Other Ambulatory Visit (INDEPENDENT_AMBULATORY_CARE_PROVIDER_SITE_OTHER): Payer: Medicaid Other

## 2020-01-21 ENCOUNTER — Encounter: Payer: Self-pay | Admitting: Neurology

## 2020-01-21 VITALS — BP 87/59 | HR 86 | Ht 61.0 in | Wt 175.0 lb

## 2020-01-21 DIAGNOSIS — G43109 Migraine with aura, not intractable, without status migrainosus: Secondary | ICD-10-CM

## 2020-01-21 DIAGNOSIS — R202 Paresthesia of skin: Secondary | ICD-10-CM

## 2020-01-21 DIAGNOSIS — G43009 Migraine without aura, not intractable, without status migrainosus: Secondary | ICD-10-CM | POA: Diagnosis not present

## 2020-01-21 DIAGNOSIS — R251 Tremor, unspecified: Secondary | ICD-10-CM

## 2020-01-21 LAB — TSH: TSH: 1.51 u[IU]/mL (ref 0.35–4.50)

## 2020-01-21 LAB — VITAMIN B12: Vitamin B-12: 409 pg/mL (ref 211–911)

## 2020-01-21 LAB — SEDIMENTATION RATE: Sed Rate: 36 mm/hr — ABNORMAL HIGH (ref 0–20)

## 2020-01-21 MED ORDER — SERTRALINE HCL 50 MG PO TABS
50.0000 mg | ORAL_TABLET | Freq: Every day | ORAL | 11 refills | Status: DC
Start: 2020-01-21 — End: 2021-09-10

## 2020-01-21 NOTE — Progress Notes (Signed)
NEUROLOGY FOLLOW UP OFFICE NOTE  Jerney Baksh [id number] 15-Sep-2000  HISTORY OF PRESENT ILLNESS: I had the pleasure of seeing Miranda Prince in follow-up in the neurology clinic on 01/21/2020.  The patient was last seen 9 months ago for migraines and dizziness. Since her last visit, she has delivered a healthy baby girl last April 2021. She initially presented with daily headaches and dizziness with good response to Sertraline 71m daily. Migraines and dizziness had recurred when she was off medication in the past. She contacted our office in July about tingling on the right side of her head that may or may not occur with headaches. Extremities not affected. Symptoms have slowed down, mostly over the right temple, occurring every 3 days or so. She had noticed her hands and feet start shaking a little, sometimes her right hand or left foot. Symptoms last a few seconds occurring twice a day, however they have quieted down. She is having more migraines. Dizziness is not as bad. She is not sleeping well, usually with 6 hours of sleep. She occasionally takes naps. She has some neck tenderness and a lot of back pain. No vision changes. No falls.   History on Initial Assessment 06/10/2016: This is an 19yo RH woman who presented with worsening headaches and dizziness. She reports symptoms started intermittently at age 19 but have gotten worse the past 4 weeks, now occurring on a daily basis. She has throbbing headaches mostly over the right parietal region, but also over the frontal/vertex region, with associated nausea and dizziness. She describes the dizziness as either the room spinning or lightheadedness when she walks. Initially they were occurring intermittently with good response to Ibuprofen, however she states that over the past 4 weeks, symptoms have been near-constant. She would have headaches on a daily basis with an intensity of 4 or 5 over 10. The dizziness comes and goes. She has difficulty  stating the duration, they mostly worsen when she is doing activities, which has mad it difficult for her to do anything at school. She does report that her grades are fine. She does not take any medication now for these. There is no photo/phonophobia, vomiting, visual obscurations, tinnitus, focal numbness/tingling/weakness in her extremities, but she does have tingling on the right parietal region when it throbs. She has neck and back pain, no diplopia, dysarthria, bowel/bladder dysfunction. She has noticed some trouble swallowing the past 2 weeks. Her mother used to have migraines. She has not had any brain imaging in the past. She reports mood is "neutral." She reports good sleep except when she has headaches, appetite is good.    PAST MEDICAL HISTORY: Past Medical History:  Diagnosis Date  . Headache     MEDICATIONS: Current Outpatient Medications on File Prior to Visit  Medication Sig Dispense Refill  . acetaminophen (TYLENOL) 325 MG tablet Take 2 tablets (650 mg total) by mouth every 4 (four) hours as needed (for pain scale < 4). 30 tablet 0  . ferrous sulfate 325 (65 FE) MG tablet Take 1 tablet (325 mg total) by mouth daily with breakfast. 30 tablet 3  . sertraline (ZOLOFT) 25 MG tablet Take 1 tablet (25 mg total) by mouth daily. 90 tablet 3   No current facility-administered medications on file prior to visit.    ALLERGIES: No Known Allergies  FAMILY HISTORY: Family History  Problem Relation Age of Onset  . Diabetes Other   . Cancer Other   . Diabetes Mother   . Diabetes Maternal  Uncle   . Depression Paternal Aunt   . Asthma Paternal Aunt   . COPD Paternal 46   . Anxiety disorder Paternal Aunt   . Seizures Maternal Grandmother   . Stroke Paternal Grandfather     SOCIAL HISTORY: Social History   Socioeconomic History  . Marital status: Married    Spouse name: Not on file  . Number of children: Not on file  . Years of education: Not on file  . Highest education  level: Not on file  Occupational History  . Not on file  Tobacco Use  . Smoking status: Never Smoker  . Smokeless tobacco: Never Used  Vaping Use  . Vaping Use: Some days  Substance and Sexual Activity  . Alcohol use: No  . Drug use: No  . Sexual activity: Yes    Partners: Male    Birth control/protection: None  Other Topics Concern  . Not on file  Social History Narrative  . Not on file   Social Determinants of Health   Financial Resource Strain:   . Difficulty of Paying Living Expenses: Not on file  Food Insecurity:   . Worried About Charity fundraiser in the Last Year: Not on file  . Ran Out of Food in the Last Year: Not on file  Transportation Needs:   . Lack of Transportation (Medical): Not on file  . Lack of Transportation (Non-Medical): Not on file  Physical Activity:   . Days of Exercise per Week: Not on file  . Minutes of Exercise per Session: Not on file  Stress:   . Feeling of Stress : Not on file  Social Connections:   . Frequency of Communication with Friends and Family: Not on file  . Frequency of Social Gatherings with Friends and Family: Not on file  . Attends Religious Services: Not on file  . Active Member of Clubs or Organizations: Not on file  . Attends Archivist Meetings: Not on file  . Marital Status: Not on file  Intimate Partner Violence:   . Fear of Current or Ex-Partner: Not on file  . Emotionally Abused: Not on file  . Physically Abused: Not on file  . Sexually Abused: Not on file     PHYSICAL EXAM: Vitals:   01/21/20 1429  BP: (!) 87/59  Pulse: 86  SpO2: 99%   General: No acute distress Head:  Normocephalic/atraumatic Skin/Extremities: No rash, no edema Neurological Exam: alert and oriented to person, place, and time. No aphasia or dysarthria. Fund of knowledge is appropriate.  Recent and remote memory are intact.  Attention and concentration are normal.   Cranial nerves: Pupils equal, round. Extraocular movements  intact with no nystagmus. Visual fields full.  No facial asymmetry.  Motor: Bulk and tone normal, muscle strength 5/5 throughout with no pronator drift.   Finger to nose testing intact.  Gait narrow-based and steady, able to tandem walk adequately.  Romberg negative.   IMPRESSION: This is a 19 yo RH woman with a history of headaches and dizziness since childhood suggestive of migraines without aura, who initially presented in 2018 with a change in headaches and dizziness that were occurring on a daily basis. MRI brain normal in 2018. She had excellent response to Zoloft, with migraine recurrence when she was off medication. She is reporting an increase in migraines, as well as new symptoms of paresthesias over the right temporal region. Exam normal. Symptoms may still relate to migraines. Check TSH, B12, ESR. We discussed increasing  Sertraline to 23m daily. Continue to monitor symptoms, follow-up as scheduled in February 2022, she knows to call for any changes.   Thank you for allowing me to participate in her care.  Please do not hesitate to call for any questions or concerns.   KEllouise Newer M.D.   CC: ACarles Collet PA-C

## 2020-01-21 NOTE — Patient Instructions (Signed)
1. Bloodwork for TSH, B12, ESR  2. Increase Sertraline to 38m daily. With your current bottle of Sertraline 258m Take 2 tablets daily. Once finished, your new bottle will be for Sertraline 5054mTake 1 tablet daily  3. Follow-up as scheduled in February, call for any changes

## 2020-01-24 LAB — CERVICOVAGINAL ANCILLARY ONLY
Bacterial Vaginitis (gardnerella): NEGATIVE
Candida Glabrata: NEGATIVE
Candida Vaginitis: NEGATIVE
Chlamydia: NEGATIVE
Comment: NEGATIVE
Comment: NEGATIVE
Comment: NEGATIVE
Comment: NEGATIVE
Comment: NEGATIVE
Comment: NORMAL
Neisseria Gonorrhea: NEGATIVE
Trichomonas: NEGATIVE

## 2020-01-24 LAB — CYTOLOGY - PAP: Diagnosis: NEGATIVE

## 2020-02-16 DIAGNOSIS — Z419 Encounter for procedure for purposes other than remedying health state, unspecified: Secondary | ICD-10-CM | POA: Diagnosis not present

## 2020-03-18 DIAGNOSIS — Z419 Encounter for procedure for purposes other than remedying health state, unspecified: Secondary | ICD-10-CM | POA: Diagnosis not present

## 2020-03-23 ENCOUNTER — Encounter: Payer: Self-pay | Admitting: Emergency Medicine

## 2020-03-23 ENCOUNTER — Emergency Department
Admission: EM | Admit: 2020-03-23 | Discharge: 2020-03-23 | Disposition: A | Discharge status: Home / Self Care | Payer: Medicaid Other | Attending: Emergency Medicine | Admitting: Emergency Medicine

## 2020-03-23 ENCOUNTER — Other Ambulatory Visit: Payer: Self-pay

## 2020-03-23 DIAGNOSIS — L089 Local infection of the skin and subcutaneous tissue, unspecified: Secondary | ICD-10-CM

## 2020-03-23 DIAGNOSIS — L723 Sebaceous cyst: Secondary | ICD-10-CM | POA: Insufficient documentation

## 2020-03-23 DIAGNOSIS — R222 Localized swelling, mass and lump, trunk: Secondary | ICD-10-CM | POA: Diagnosis present

## 2020-03-23 MED ORDER — SULFAMETHOXAZOLE-TRIMETHOPRIM 800-160 MG PO TABS
1.0000 | ORAL_TABLET | Freq: Two times a day (BID) | ORAL | 0 refills | Status: DC
Start: 2020-03-23 — End: 2020-04-03

## 2020-03-23 MED ORDER — CEPHALEXIN 500 MG PO CAPS: 500.0000 mg | ORAL_CAPSULE | Freq: Three times a day (TID) | ORAL | 0 refills | Status: AC

## 2020-03-23 NOTE — ED Notes (Signed)
Per [place], [place], no bloodwork or orders received at this time.

## 2020-03-23 NOTE — Discharge Instructions (Signed)
You are being treated conservatively for possible infected sebaceous cyst.  Antibiotics as prescribed, and apply warm compresses to help promote healing or spontaneous drainage.  Follow-up with local dermatology clinic for definitive treatment.  Return to the ED if needed.

## 2020-03-23 NOTE — ED Triage Notes (Signed)
Pt to ED via POV with c/o palpable mass to center of chest. Pt states has been there x several years but has never had it evaluated. Pt states recently the mass has begun hurting.

## 2020-03-23 NOTE — ED Provider Notes (Signed)
[facility] Emergency Department Provider Note ____________________________________________  Time seen: 1240  I have reviewed the triage vital signs and the nursing notes.  HISTORY  Chief Complaint  Mass  HPI Miranda Prince is a 20 y.o. female presented to the ED with an acutely tender subcutaneous mass to the anterior chest.  Patient describes a small pea-sized cyst has been in her mid chest between her breast, for years.  She recalls once as a [age] or greater, she squeezed the area and had some purulent discharge, but reports the cyst recurred.  It has been stable and quiet until about 2 days ago when she began experience some mild tenderness.  She denies any spontaneous purulent drainage, pointing, fluctuance, or redness.  She also denies any punctum to the same area.  Has not previously been evaluated by a primary doctor, general surgeon, or dermatologist.   Past Medical History:  Diagnosis Date  . Headache     Patient Active Problem List   Diagnosis Date Noted  . Labor and delivery, indication for care 07/12/2019  . E-coli UTI 12/15/2018  . History of premature rupture of membranes 04/08/2017  . Migraine without aura and without status migrainosus, not intractable 06/10/2016  . 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 No pertinent surgical history.  Prior to Admission medications   Medication Sig Start Date End Date Taking? Authorizing Provider  cephALEXin (KEFLEX) 500 MG capsule Take 1 capsule (500 mg total) by mouth 3 (three) times daily for 7 days. 03/23/20 03/30/20 Yes Toneshia Coello, Charlesetta Ivory, PA-C  sulfamethoxazole-trimethoprim (BACTRIM DS) 800-160 MG tablet Take 1 tablet by mouth 2 (two) times daily. 03/23/20  Yes Cumi Sanagustin, Charlesetta Ivory, PA-C  acetaminophen (TYLENOL) 325 MG tablet Take 2 tablets (650 mg total) by mouth every 4 (four) hours as needed (for pain scale < 4). 07/13/19   Doreene Burke, CNM  ferrous sulfate 325 (65 FE) MG  tablet Take 1 tablet (325 mg total) by mouth daily with breakfast. 07/13/19   Doreene Burke, CNM  sertraline (ZOLOFT) 50 MG tablet Take 1 tablet (50 mg total) by mouth daily. 01/21/20   Van Clines, MD    Allergies Patient has no known allergies.  Family History  Problem Relation Age of Onset  . Diabetes Other   . Cancer Other   . Diabetes Mother   . Diabetes Maternal Uncle   . Depression Paternal Aunt   . Asthma Paternal Aunt   . COPD Paternal Aunt   . Anxiety disorder Paternal Aunt   . Seizures Maternal Grandmother   . Stroke Paternal Grandfather     Social History Social History   Tobacco Use  . Smoking status: Never Smoker  . Smokeless tobacco: Never Used  Vaping Use  . Vaping Use: Every day  Substance Use Topics  . Alcohol use: No  . Drug use: No    Review of Systems  Constitutional: Negative for fever. Cardiovascular: Negative for chest pain. Respiratory: Negative for shortness of breath. Gastrointestinal: Negative for abdominal pain, vomiting and diarrhea. Genitourinary: Negative for dysuria. Musculoskeletal: Negative for back pain. Skin: Negative for rash.  Tender subcutaneous cyst as above. Neurological: Negative for headaches, focal weakness or numbness. ____________________________________________  PHYSICAL EXAM:  VITAL SIGNS: ED Triage Vitals  Enc Vitals Group     BP 03/23/20 1037 (!) 107/50     Pulse Rate 03/23/20 1037 88     Resp 03/23/20 1037 18     Temp 03/23/20 1037 98.2 F (36.8  C)     Temp Source 03/23/20 1037 Axillary     SpO2 03/23/20 1037 99 %     Weight 03/23/20 1037 168 lb (76.2 kg)     Height 03/23/20 1037 5\' 1"  (1.549 m)     Head Circumference --      Peak Flow --      Pain Score 03/23/20 1045 1     Pain Loc --      Pain Edu? --      Excl. in GC? --     Constitutional: Alert and oriented. Well appearing and in no distress. Head: Normocephalic and atraumatic. Eyes: Conjunctivae are normal. Normal extraocular  movements Cardiovascular: Normal rate, regular rhythm. Normal distal pulses. Respiratory: Normal respiratory effort. Musculoskeletal: Nontender with normal range of motion in all extremities.  Neurologic:  Normal gait without ataxia. Normal speech and language. No gross focal neurologic deficits are appreciated. Skin:  Skin is warm, dry and intact. No rash noted.  Patient with a palpable mildly tender subcutaneous cystic lesion to the mid chest overlying the sternum.  The overlying skin is without erythema, induration, warmth, or pointing.  No punctum noted, no sebum extruded.  No surrounding skin changes are appreciated. ____________________________________________  PROCEDURES  Procedures ____________________________________________  INITIAL IMPRESSION / ASSESSMENT AND PLAN / ED COURSE  Patient to ED with what appears to be a mildly inflamed or infected subcutaneous sebaceous cyst.  Patient will be treated conservatively with Bactrim and Keflex for MRSA and MSSA coverage respectively.  Patient will continue to monitor the area applying warm compresses to promote healing and/or spontaneous drainage.  She has been encouraged to return to the ED if needed, or follow-up with dermatology for definitive, excisional surgery.  Miranda Prince was evaluated in Emergency Department on 03/23/2020 for the symptoms described in the history of present illness. She was evaluated in the context of the global COVID-19 pandemic, which necessitated consideration that the patient might be at risk for infection with the SARS-CoV-2 virus that causes COVID-19. Institutional protocols and algorithms that pertain to the evaluation of patients at risk for COVID-19 are in a state of rapid change based on information released by regulatory bodies including the CDC and federal and state organizations. These policies and algorithms were followed during the patient's care in the  ED. ____________________________________________  FINAL CLINICAL IMPRESSION(S) / ED DIAGNOSES  Final diagnoses:  Infected sebaceous cyst of skin      05/21/2020, Karmen Stabs, PA-C 03/23/20 1511    05/21/20, MD 03/23/20 1540

## 2020-04-03 ENCOUNTER — Emergency Department
Admission: EM | Admit: 2020-04-03 | Discharge: 2020-04-03 | Disposition: A | Discharge status: Home / Self Care | Payer: Medicaid Other | Attending: Emergency Medicine | Admitting: Emergency Medicine

## 2020-04-03 ENCOUNTER — Encounter: Payer: Self-pay | Admitting: Emergency Medicine

## 2020-04-03 ENCOUNTER — Other Ambulatory Visit: Payer: Self-pay

## 2020-04-03 ENCOUNTER — Ambulatory Visit: Payer: Medicaid Other | Admitting: Physician Assistant

## 2020-04-03 DIAGNOSIS — Z7689 Persons encountering health services in other specified circumstances: Secondary | ICD-10-CM

## 2020-04-03 DIAGNOSIS — L723 Sebaceous cyst: Secondary | ICD-10-CM | POA: Insufficient documentation

## 2020-04-03 DIAGNOSIS — L02213 Cutaneous abscess of chest wall: Secondary | ICD-10-CM | POA: Diagnosis not present

## 2020-04-03 DIAGNOSIS — Z48817 Encounter for surgical aftercare following surgery on the skin and subcutaneous tissue: Secondary | ICD-10-CM | POA: Insufficient documentation

## 2020-04-03 MED ORDER — HYDROCODONE-ACETAMINOPHEN 5-325 MG PO TABS: 1.0000 | ORAL_TABLET | Freq: Three times a day (TID) | ORAL | 0 refills | Status: AC | PRN

## 2020-04-03 MED ORDER — LIDOCAINE HCL (PF) 1 % IJ SOLN
5.0000 mL | Freq: Once | INTRAMUSCULAR | Status: AC
  Administered 2020-04-03: 5 mL
  Filled 2020-04-03: qty 5

## 2020-04-03 NOTE — Discharge Instructions (Signed)
Keep the wound clean, dry, and covered. Take the pain medicine as needed. Remove the packing in 3 days or return for wound check and packing removal as discussed.

## 2020-04-03 NOTE — ED Triage Notes (Signed)
Pt reports abscess or cyst on the center of her chest that is bigger and painful. Pt was seen here on the 6th for the same but it is bigger now

## 2020-04-03 NOTE — ED Provider Notes (Signed)
[facility] Emergency Department Provider Note ____________________________________________  Time seen: 1829  I have reviewed the triage vital signs and the nursing notes.  HISTORY  Chief Complaint  Abscess  HPI Miranda Prince is a 20 y.o. female presented to the ED for evaluation management of an infected sebaceous cyst of the chest wall.  Patient was seen in the ED about a week and a half earlier, where she was started on an empiric course of antibiotics.  She managed to wean as discussed, and return to the ED after it "came to ahead.  She denies any spontaneous purulent drainage.   Past Medical History:  Diagnosis Date  . Headache     Patient Active Problem List   Diagnosis Date Noted  . Labor and delivery, indication for care 07/12/2019  . E-coli UTI 12/15/2018  . History of premature rupture of membranes 04/08/2017  . Migraine without aura and without status migrainosus, not intractable 06/10/2016  . 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 No pertinent surgical history.  Prior to Admission medications   Medication Sig Start Date End Date Taking? Authorizing Provider  HYDROcodone-acetaminophen (NORCO) 5-325 MG tablet Take 1 tablet by mouth 3 (three) times daily as needed for up to 3 days. 04/03/20 04/06/20 Yes Erwin Nishiyama, Charlesetta Ivory, PA-C  acetaminophen (TYLENOL) 325 MG tablet Take 2 tablets (650 mg total) by mouth every 4 (four) hours as needed (for pain scale < 4). 07/13/19   Doreene Burke, CNM  ferrous sulfate 325 (65 FE) MG tablet Take 1 tablet (325 mg total) by mouth daily with breakfast. 07/13/19   Doreene Burke, CNM  sertraline (ZOLOFT) 50 MG tablet Take 1 tablet (50 mg total) by mouth daily. 01/21/20   Van Clines, MD  sulfamethoxazole-trimethoprim (BACTRIM DS) 800-160 MG tablet Take 1 tablet by mouth 2 (two) times daily. 03/23/20   Soraiya Ahner, Charlesetta Ivory, PA-C    Allergies Patient has no known allergies.  Family History   Problem Relation Age of Onset  . Diabetes Other   . Cancer Other   . Diabetes Mother   . Diabetes Maternal Uncle   . Depression Paternal Aunt   . Asthma Paternal Aunt   . COPD Paternal Aunt   . Anxiety disorder Paternal Aunt   . Seizures Maternal Grandmother   . Stroke Paternal Grandfather     Social History Social History   Tobacco Use  . Smoking status: Never Smoker  . Smokeless tobacco: Never Used  Vaping Use  . Vaping Use: Every day  Substance Use Topics  . Alcohol use: No  . Drug use: No    Review of Systems  Constitutional: Negative for fever. Cardiovascular: Negative for chest pain. Respiratory: Negative for shortness of breath. Gastrointestinal: Negative for abdominal pain, vomiting and diarrhea. Genitourinary: Negative for dysuria. Musculoskeletal: Negative for back pain. Skin: Negative for rash.  Chest wall abscess as above. Neurological: Negative for headaches, focal weakness or numbness. ____________________________________________  PHYSICAL EXAM:  VITAL SIGNS: ED Triage Vitals  Enc Vitals Group     BP 04/03/20 1513 107/61     Pulse Rate 04/03/20 1513 93     Resp 04/03/20 1513 20     Temp 04/03/20 1513 98.4 F (36.9 C)     Temp Source 04/03/20 1513 Oral     SpO2 04/03/20 1513 97 %     Weight 04/03/20 1500 165 lb (74.8 kg)     Height 04/03/20 1500 5\' 1"  (1.549 m)  Head Circumference --      Peak Flow --      Pain Score 04/03/20 1500 7     Pain Loc --      Pain Edu? --      Excl. in GC? --     Constitutional: Alert and oriented. Well appearing and in no distress. Head: Normocephalic and atraumatic. Eyes: Conjunctivae are normal. Normal extraocular movements Neck: Supple. No thyromegaly. Cardiovascular: Normal rate, regular rhythm. Normal distal pulses. Respiratory: Normal respiratory effort. No wheezes/rales/rhonchi. Gastrointestinal: Soft and nontender. No distention. Musculoskeletal: Nontender with normal range of motion in all  extremities.  Neurologic:  Normal gait without ataxia. Normal speech and language. No gross focal neurologic deficits are appreciated. Skin:  Skin is warm, dry and intact. No rash noted.  Patient with a 3 cm area was prescribed today anterior chest between the breasts, that is rounded, erythematous, tender to palpation.  There is no spontaneous purulent drainage, but there is a central area of purulence appreciated. Psychiatric: Mood and affect are normal. Patient exhibits appropriate insight and judgment. ____________________________________________  PROCEDURES  .Marland KitchenIncision and Drainage  Date/Time: 04/03/2020 7:40 PM Performed by: Lissa Hoard, PA-C Authorized by: Lissa Hoard, PA-C   Consent:    Consent obtained:  Verbal   Consent given by:  Patient   Risks, benefits, and alternatives were discussed: yes     Risks discussed:  Infection, incomplete drainage and pain Universal protocol:    Procedure explained and questions answered to patient or proxy's satisfaction: yes     Site/side marked: yes     Patient identity confirmed:  Verbally with patient Location:    Type:  Abscess   Size:  3 cm   Location:  Trunk   Trunk location:  Chest Pre-procedure details:    Skin preparation:  Povidone-iodine Sedation:    Sedation type:  None Anesthesia:    Anesthesia method:  Local infiltration   Local anesthetic:  Lidocaine 1% w/o epi Procedure type:    Complexity:  Simple Procedure details:    Ultrasound guidance: no     Needle aspiration: no     Incision types:  Single straight   Incision depth:  Subcutaneous   Wound management:  Irrigated with saline   Drainage:  Purulent   Drainage amount:  Copious   Wound treatment:  Wound left open   Packing materials:  1/4 in iodoform gauze   Amount 1/4" iodoform:  5 Post-procedure details:    Procedure completion:  Tolerated well, no immediate complications  ____________________________________________  INITIAL  IMPRESSION / ASSESSMENT AND PLAN / ED COURSE  Patient ED evaluation management of a local infected sebaceous cyst to the anterior chest.  Patient presents for I&D procedure.  The risk were discussed and she agreed to proceed.  The anterior chest wall was anesthetized adequately and a single straight vertical incision was made.  Purulent drainage expressed copiously from the open wound.  Portions of the friable cyst wall were also expressed.  The wound was packed with iodoform dressing and sterile dressing was applied.  Patient is discharged wound care instructions and supplies.  Antibiotic prescription was not rewritten at this time.  The chest wall erythema and induration was immediately resolved after the procedure.  Patient was given a small prescription for hydrocodone for pain.  She will follow-up with her primary provider or return to this ED in 3 days if needed for wound check and pack removal.   Miranda Prince was evaluated in  Emergency Department on 04/03/2020 for the symptoms described in the history of present illness. She was evaluated in the context of the global COVID-19 pandemic, which necessitated consideration that the patient might be at risk for infection with the SARS-CoV-2 virus that causes COVID-19. Institutional protocols and algorithms that pertain to the evaluation of patients at risk for COVID-19 are in a state of rapid change based on information released by regulatory bodies including the CDC and federal and state organizations. These policies and algorithms were followed during the patient's care in the ED.  I reviewed the patient's prescription history over the last 12 months in the multi-state controlled substances database(s) that includes [place], [place], [place], [place], [place], [place], [place], [place], [place], [place], [place], [place], [place], and [place].  Results were notable for no RX  history. ____________________________________________  FINAL CLINICAL IMPRESSION(S) / ED DIAGNOSES  Final diagnoses:  Encounter for incision and drainage procedure  Sebaceous cyst      Lissa Hoard, PA-C 04/03/20 2003    Phineas Semen, MD 04/03/20 2122

## 2020-04-05 ENCOUNTER — Ambulatory Visit: Payer: Medicaid Other | Admitting: Physician Assistant

## 2020-04-17 ENCOUNTER — Other Ambulatory Visit: Payer: Self-pay

## 2020-04-17 ENCOUNTER — Ambulatory Visit: Payer: Medicaid Other | Admitting: Physician Assistant

## 2020-04-17 NOTE — Progress Notes (Deleted)
      Established patient visit   Patient: Miranda Prince   DOB: [birth year]   20 y.o. Female  MRN: [id number] Visit Date: 04/17/2020  Today's healthcare provider: Trey Sailors, PA-C   No chief complaint on file.  Subjective    HPI  Follow up ER visit  Patient was seen in ER for *** on ***. She was treated for ***. Treatment for this included ***. She reports {DESC; EXCELLENT/GOOD/FAIR:19992} compliance with treatment. She reports this condition is {improved/worse/unchanged:3041574}.  -----------------------------------------------------------------------------------------   {Show patient history (optional):23778::" "}   Medications: Outpatient Medications Prior to Visit  Medication Sig  . acetaminophen (TYLENOL) 325 MG tablet Take 2 tablets (650 mg total) by mouth every 4 (four) hours as needed (for pain scale < 4).  . ferrous sulfate 325 (65 FE) MG tablet Take 1 tablet (325 mg total) by mouth daily with breakfast.  . sertraline (ZOLOFT) 50 MG tablet Take 1 tablet (50 mg total) by mouth daily.   No facility-administered medications prior to visit.    Review of Systems  {Labs  Heme  Chem  Endocrine  Serology  Results Review (optional):23779::" "}   Objective    There were no vitals taken for this visit. {Show previous vital signs (optional):23777::" "}   Physical Exam  ***  No results found for any visits on 04/17/20.  Assessment & Plan     ***  No follow-ups on file.      {provider attestation***:1}   Maryella Shivers  [facility] [phone] (phone) [phone] (fax)  [facility]

## 2020-04-18 DIAGNOSIS — Z419 Encounter for procedure for purposes other than remedying health state, unspecified: Secondary | ICD-10-CM | POA: Diagnosis not present

## 2020-04-25 ENCOUNTER — Ambulatory Visit: Payer: Medicaid Other | Admitting: Neurology

## 2020-05-16 DIAGNOSIS — Z419 Encounter for procedure for purposes other than remedying health state, unspecified: Secondary | ICD-10-CM | POA: Diagnosis not present

## 2020-06-16 DIAGNOSIS — Z419 Encounter for procedure for purposes other than remedying health state, unspecified: Secondary | ICD-10-CM | POA: Diagnosis not present

## 2020-07-16 DIAGNOSIS — Z419 Encounter for procedure for purposes other than remedying health state, unspecified: Secondary | ICD-10-CM | POA: Diagnosis not present

## 2020-08-16 DIAGNOSIS — Z419 Encounter for procedure for purposes other than remedying health state, unspecified: Secondary | ICD-10-CM | POA: Diagnosis not present

## 2020-09-15 DIAGNOSIS — Z419 Encounter for procedure for purposes other than remedying health state, unspecified: Secondary | ICD-10-CM | POA: Diagnosis not present

## 2020-10-16 DIAGNOSIS — Z419 Encounter for procedure for purposes other than remedying health state, unspecified: Secondary | ICD-10-CM | POA: Diagnosis not present

## 2020-10-28 ENCOUNTER — Emergency Department
Admission: EM | Admit: 2020-10-28 | Discharge: 2020-10-28 | Disposition: A | Discharge status: Home / Self Care | Payer: Medicaid Other | Attending: Emergency Medicine | Admitting: Emergency Medicine

## 2020-10-28 ENCOUNTER — Emergency Department: Payer: Medicaid Other

## 2020-10-28 ENCOUNTER — Other Ambulatory Visit: Payer: Self-pay

## 2020-10-28 DIAGNOSIS — R079 Chest pain, unspecified: Secondary | ICD-10-CM | POA: Diagnosis not present

## 2020-10-28 DIAGNOSIS — Z5321 Procedure and treatment not carried out due to patient leaving prior to being seen by health care provider: Secondary | ICD-10-CM | POA: Insufficient documentation

## 2020-10-28 DIAGNOSIS — R059 Cough, unspecified: Secondary | ICD-10-CM | POA: Diagnosis not present

## 2020-10-28 DIAGNOSIS — Z20822 Contact with and (suspected) exposure to covid-19: Secondary | ICD-10-CM | POA: Diagnosis not present

## 2020-10-28 LAB — BASIC METABOLIC PANEL
Anion gap: 10 (ref 5–15)
BUN: 12 mg/dL (ref 6–20)
CO2: 24 mmol/L (ref 22–32)
Calcium: 9.6 mg/dL (ref 8.9–10.3)
Chloride: 102 mmol/L (ref 98–111)
Creatinine, Ser: 0.58 mg/dL (ref 0.44–1.00)
GFR, Estimated: >60 mL/min (ref >60)
Glucose, Bld: 98 mg/dL (ref 70–99)
Potassium: 3.9 mmol/L (ref 3.5–5.1)
Sodium: 136 mmol/L (ref 135–145)

## 2020-10-28 LAB — CBC
HCT: 43.1 % (ref 36.0–46.0)
Hemoglobin: 14.5 g/dL (ref 12.0–15.0)
MCH: 28.3 pg (ref 26.0–34.0)
MCHC: 33.6 g/dL (ref 30.0–36.0)
MCV: 84.2 fL (ref 80.0–100.0)
Platelets: 422 10*3/uL — ABNORMAL HIGH (ref 150–400)
RBC: 5.12 MIL/uL — ABNORMAL HIGH (ref 3.87–5.11)
RDW: 13.8 % (ref 11.5–15.5)
WBC: 11.5 10*3/uL — ABNORMAL HIGH (ref 4.0–10.5)
nRBC: 0 % (ref 0.0–0.2)

## 2020-10-28 LAB — RESP PANEL BY RT-PCR (FLU A&B, COVID) ARPGX2
Influenza A by PCR: NEGATIVE
Influenza B by PCR: NEGATIVE
SARS Coronavirus 2 by RT PCR: NEGATIVE

## 2020-10-28 LAB — TROPONIN I (HIGH SENSITIVITY): Troponin I (High Sensitivity): 2 ng/L (ref <18)

## 2020-10-28 NOTE — ED Triage Notes (Signed)
Pt reports intermittent mid cp and nonproductive cough since fri morning

## 2020-11-16 DIAGNOSIS — Z419 Encounter for procedure for purposes other than remedying health state, unspecified: Secondary | ICD-10-CM | POA: Diagnosis not present

## 2020-12-16 DIAGNOSIS — Z419 Encounter for procedure for purposes other than remedying health state, unspecified: Secondary | ICD-10-CM | POA: Diagnosis not present

## 2021-01-16 DIAGNOSIS — Z419 Encounter for procedure for purposes other than remedying health state, unspecified: Secondary | ICD-10-CM | POA: Diagnosis not present

## 2021-02-15 DIAGNOSIS — Z419 Encounter for procedure for purposes other than remedying health state, unspecified: Secondary | ICD-10-CM | POA: Diagnosis not present

## 2021-03-16 ENCOUNTER — Other Ambulatory Visit: Payer: Self-pay | Admitting: Neurology

## 2021-03-18 DIAGNOSIS — Z419 Encounter for procedure for purposes other than remedying health state, unspecified: Secondary | ICD-10-CM | POA: Diagnosis not present

## 2021-04-18 DIAGNOSIS — Z419 Encounter for procedure for purposes other than remedying health state, unspecified: Secondary | ICD-10-CM | POA: Diagnosis not present

## 2021-05-16 DIAGNOSIS — Z419 Encounter for procedure for purposes other than remedying health state, unspecified: Secondary | ICD-10-CM | POA: Diagnosis not present

## 2021-06-06 ENCOUNTER — Emergency Department: Payer: Medicaid Other

## 2021-06-06 ENCOUNTER — Encounter: Payer: Self-pay | Admitting: Emergency Medicine

## 2021-06-06 ENCOUNTER — Emergency Department
Admission: EM | Admit: 2021-06-06 | Discharge: 2021-06-06 | Disposition: A | Discharge status: Home / Self Care | Payer: Medicaid Other | Attending: Student in an Organized Health Care Education/Training Program | Admitting: Student in an Organized Health Care Education/Training Program

## 2021-06-06 ENCOUNTER — Other Ambulatory Visit: Payer: Self-pay

## 2021-06-06 DIAGNOSIS — L049 Acute lymphadenitis, unspecified: Secondary | ICD-10-CM | POA: Diagnosis not present

## 2021-06-06 DIAGNOSIS — R221 Localized swelling, mass and lump, neck: Secondary | ICD-10-CM | POA: Diagnosis not present

## 2021-06-06 DIAGNOSIS — I889 Nonspecific lymphadenitis, unspecified: Secondary | ICD-10-CM | POA: Insufficient documentation

## 2021-06-06 DIAGNOSIS — M542 Cervicalgia: Secondary | ICD-10-CM | POA: Diagnosis present

## 2021-06-06 LAB — CBC WITH DIFFERENTIAL/PLATELET
Abs Immature Granulocytes: 0.03 10*3/uL (ref 0.00–0.07)
Basophils Absolute: 0.1 10*3/uL (ref 0.0–0.1)
Basophils Relative: 1 %
Eosinophils Absolute: 0.3 10*3/uL (ref 0.0–0.5)
Eosinophils Relative: 4 %
HCT: 40.7 % (ref 36.0–46.0)
Hemoglobin: 12.8 g/dL (ref 12.0–15.0)
Immature Granulocytes: 0 %
Lymphocytes Relative: 20 %
Lymphs Abs: 1.4 10*3/uL (ref 0.7–4.0)
MCH: 26.7 pg (ref 26.0–34.0)
MCHC: 31.4 g/dL (ref 30.0–36.0)
MCV: 84.8 fL (ref 80.0–100.0)
Monocytes Absolute: 0.5 10*3/uL (ref 0.1–1.0)
Monocytes Relative: 8 %
Neutro Abs: 4.4 10*3/uL (ref 1.7–7.7)
Neutrophils Relative %: 67 %
Platelets: 340 10*3/uL (ref 150–400)
RBC: 4.8 MIL/uL (ref 3.87–5.11)
RDW: 13.2 % (ref 11.5–15.5)
WBC: 6.7 10*3/uL (ref 4.0–10.5)
nRBC: 0 % (ref 0.0–0.2)

## 2021-06-06 LAB — GROUP A STREP BY PCR: Group A Strep by PCR: NOT DETECTED

## 2021-06-06 LAB — BASIC METABOLIC PANEL
Anion gap: 7 (ref 5–15)
BUN: 11 mg/dL (ref 6–20)
CO2: 26 mmol/L (ref 22–32)
Calcium: 9.1 mg/dL (ref 8.9–10.3)
Chloride: 102 mmol/L (ref 98–111)
Creatinine, Ser: 0.73 mg/dL (ref 0.44–1.00)
GFR, Estimated: >60 mL/min (ref >60)
Glucose, Bld: 93 mg/dL (ref 70–99)
Potassium: 3.8 mmol/L (ref 3.5–5.1)
Sodium: 135 mmol/L (ref 135–145)

## 2021-06-06 LAB — MONONUCLEOSIS SCREEN: Mono Screen: NEGATIVE

## 2021-06-06 MED ORDER — IOHEXOL 300 MG/ML  SOLN
75.0000 mL | Freq: Once | INTRAMUSCULAR | Status: AC | PRN
  Administered 2021-06-06: 75 mL via INTRAVENOUS
  Filled 2021-06-06: qty 75

## 2021-06-06 MED ORDER — AMOXICILLIN 875 MG PO TABS: 875.0000 mg | ORAL_TABLET | Freq: Two times a day (BID) | ORAL | 0 refills | Status: DC

## 2021-06-06 NOTE — ED Provider Notes (Signed)
? ?[facility] ?Provider Note ? ? ? Event Date/Time  ? First MD Initiated Contact with Patient 06/06/21 1254   ?  (approximate) ? ? ?History  ? ?Neck Pain ? ? ?HPI ? ?Miranda Prince is a 21 y.o. female presents emergency department with complaints of swelling to the right side of her neck.  Had a URI last week.  Fever at that time but none in the last few days.  States she noticed the swelling 1 days ago.  Denies fatigue, abdominal pain. ? ?  ? ? ?Physical Exam  ? ?Triage Vital Signs: ?ED Triage Vitals  ?Enc Vitals Group  ?   BP 06/06/21 1209 106/76  ?   Pulse Rate 06/06/21 1209 94  ?   Resp 06/06/21 1209 16  ?   Temp 06/06/21 1212 98.7 ?F (37.1 ?C)  ?   Temp Source 06/06/21 1212 Oral  ?   SpO2 06/06/21 1209 100 %  ?   Weight 06/06/21 1210 180 lb (81.6 kg)  ?   Height 06/06/21 1210 5\' 1"  (1.549 m)  ?   Head Circumference --   ?   Peak Flow --   ?   Pain Score 06/06/21 1210 4  ?   Pain Loc --   ?   Pain Edu? --   ?   Excl. in [place]? --   ? ? ?Most recent vital signs: ?Vitals:  ? 06/06/21 1212 06/06/21 1433  ?BP:  110/78  ?Pulse:  88  ?Resp:  16  ?Temp: 98.7 ?F (37.1 ?C)   ?SpO2:  100%  ? ? ? ?General: Awake, no distress.   ?CV:  Good peripheral perfusion. regular rate and  rhythm ?Resp:  Normal effort. Lungs CTA ?Abd:  No distention.   ?Other:  Throat is red and irritated, swelling noted to the right side of the neck along with tonsillar glands, no cervical lymphadenopathy noted ? ? ?ED Results / Procedures / Treatments  ? ?Labs ?(all labs ordered are listed, but only abnormal results are displayed) ?Labs Reviewed  ?GROUP A STREP BY PCR  ?MONONUCLEOSIS SCREEN  ?BASIC METABOLIC PANEL  ?CBC WITH DIFFERENTIAL/PLATELET  ? ? ? ?EKG ? ? ? ? ?RADIOLOGY ?CT soft tissue of the neck ? ? ? ?PROCEDURES: ? ? ?Procedures ? ? ?MEDICATIONS ORDERED IN ED: ?Medications  ?iohexol (OMNIPAQUE) 300 MG/ML solution 75 mL (75 mLs Intravenous Contrast Given 06/06/21 1412)  ? ? ? ?IMPRESSION / MDM / ASSESSMENT AND PLAN / ED  COURSE  ?I reviewed the triage vital signs and the nursing notes. ?             ?               ? ?Differential diagnosis includes, but is not limited to, strep throat, mononucleosis, lymphadenitis, abscess ? ?Labs are reassuring, strep test is negative, monotest is negative, CBC is normal, metabolic panel is normal ? ?Due to the right-sided neck swelling did do a CT soft tissue of the neck.   ? ?CT was independently reviewed by me.  Shows swollen lymph nodes.  Radiologist read is a 7 mm node on the right with bilateral lymphadenopathy. ? ?I did explain these findings to the patient.  She was given a prescription for amoxicillin.  She is to follow-up with ENT if not improving in 5 to 7 days.  Return if worsening.  Patient is in agreement treatment plan.  She was discharged stable condition. ? ? ?  ? ? ?  FINAL CLINICAL IMPRESSION(S) / ED DIAGNOSES  ? ?Final diagnoses:  ?Lymphadenitis  ? ? ? ?Rx / DC Orders  ? ?ED Discharge Orders   ? ?      Ordered  ?  amoxicillin (AMOXIL) 875 MG tablet  2 times daily       ? 06/06/21 1433  ? ?  ?  ? ?  ? ? ? ?Note:  This document was prepared using Dragon voice recognition software and may include unintentional dictation errors. ? ?  ?Versie Starks, PA-C ?06/06/21 1438 ? ?  ?Vladimir Crofts, MD ?06/06/21 1530 ? ?

## 2021-06-06 NOTE — ED Triage Notes (Signed)
Pt to ED via POV c/o right sided neck pain and swelling in her neck. Pt states that she has had symptoms for about 3 days. Pt states that she was sick recently with fever and URI symptoms. Pt states that she took at home COVID test and that it was negative. Pt is in NAD.  ? ?

## 2021-06-06 NOTE — Discharge Instructions (Signed)
Follow-up with your regular doctor as needed.  Follow-up with [facility] if your lymph node has not decreased in size in 1 week.  Return if worsening ?

## 2021-06-16 DIAGNOSIS — Z419 Encounter for procedure for purposes other than remedying health state, unspecified: Secondary | ICD-10-CM | POA: Diagnosis not present

## 2021-07-16 DIAGNOSIS — Z419 Encounter for procedure for purposes other than remedying health state, unspecified: Secondary | ICD-10-CM | POA: Diagnosis not present

## 2021-08-16 DIAGNOSIS — Z419 Encounter for procedure for purposes other than remedying health state, unspecified: Secondary | ICD-10-CM | POA: Diagnosis not present

## 2021-08-30 DIAGNOSIS — E669 Obesity, unspecified: Secondary | ICD-10-CM | POA: Diagnosis not present

## 2021-08-30 DIAGNOSIS — Z823 Family history of stroke: Secondary | ICD-10-CM | POA: Diagnosis not present

## 2021-08-30 DIAGNOSIS — Z6832 Body mass index (BMI) 32.0-32.9, adult: Secondary | ICD-10-CM | POA: Diagnosis not present

## 2021-08-30 DIAGNOSIS — L309 Dermatitis, unspecified: Secondary | ICD-10-CM | POA: Diagnosis not present

## 2021-08-30 DIAGNOSIS — Z833 Family history of diabetes mellitus: Secondary | ICD-10-CM | POA: Diagnosis not present

## 2021-08-30 DIAGNOSIS — Z803 Family history of malignant neoplasm of breast: Secondary | ICD-10-CM | POA: Diagnosis not present

## 2021-08-30 DIAGNOSIS — I951 Orthostatic hypotension: Secondary | ICD-10-CM | POA: Diagnosis not present

## 2021-08-30 DIAGNOSIS — G43909 Migraine, unspecified, not intractable, without status migrainosus: Secondary | ICD-10-CM | POA: Diagnosis not present

## 2021-09-03 DIAGNOSIS — R002 Palpitations: Secondary | ICD-10-CM | POA: Diagnosis not present

## 2021-09-03 DIAGNOSIS — R0602 Shortness of breath: Secondary | ICD-10-CM | POA: Diagnosis not present

## 2021-09-07 ENCOUNTER — Ambulatory Visit: Payer: Medicaid Other | Admitting: Neurology

## 2021-09-10 ENCOUNTER — Encounter: Payer: Self-pay | Admitting: Neurology

## 2021-09-10 ENCOUNTER — Ambulatory Visit: Payer: Medicaid Other | Admitting: Neurology

## 2021-09-10 VITALS — BP 104/65 | HR 98 | Ht 61.0 in | Wt 172.4 lb

## 2021-09-10 DIAGNOSIS — G43009 Migraine without aura, not intractable, without status migrainosus: Secondary | ICD-10-CM | POA: Diagnosis not present

## 2021-09-10 MED ORDER — SERTRALINE HCL 50 MG PO TABS: 50.0000 mg | ORAL_TABLET | Freq: Every day | ORAL | 3 refills | Status: DC

## 2021-09-15 DIAGNOSIS — Z419 Encounter for procedure for purposes other than remedying health state, unspecified: Secondary | ICD-10-CM | POA: Diagnosis not present

## 2021-10-16 DIAGNOSIS — Z419 Encounter for procedure for purposes other than remedying health state, unspecified: Secondary | ICD-10-CM | POA: Diagnosis not present

## 2021-11-16 DIAGNOSIS — Z419 Encounter for procedure for purposes other than remedying health state, unspecified: Secondary | ICD-10-CM | POA: Diagnosis not present

## 2021-12-16 DIAGNOSIS — Z419 Encounter for procedure for purposes other than remedying health state, unspecified: Secondary | ICD-10-CM | POA: Diagnosis not present

## 2022-01-07 ENCOUNTER — Ambulatory Visit (INDEPENDENT_AMBULATORY_CARE_PROVIDER_SITE_OTHER): Payer: Medicaid Other

## 2022-01-07 VITALS — BP 120/80 | Ht 61.0 in | Wt 166.0 lb

## 2022-01-07 DIAGNOSIS — Z3200 Encounter for pregnancy test, result unknown: Secondary | ICD-10-CM

## 2022-01-07 DIAGNOSIS — Z3201 Encounter for pregnancy test, result positive: Secondary | ICD-10-CM | POA: Diagnosis not present

## 2022-01-07 LAB — POCT URINE PREGNANCY: Preg Test, Ur: POSITIVE — AB

## 2022-01-07 IMAGING — CR DG CHEST 2V
2 series · 2 of 2 positions shown · non-contrast
Comparison: None.

CLINICAL DATA: 20-year-old female with chest pain and nonproductive
cough since yesterday morning.

EXAM:
CHEST - 2 VIEW

[chest pa]
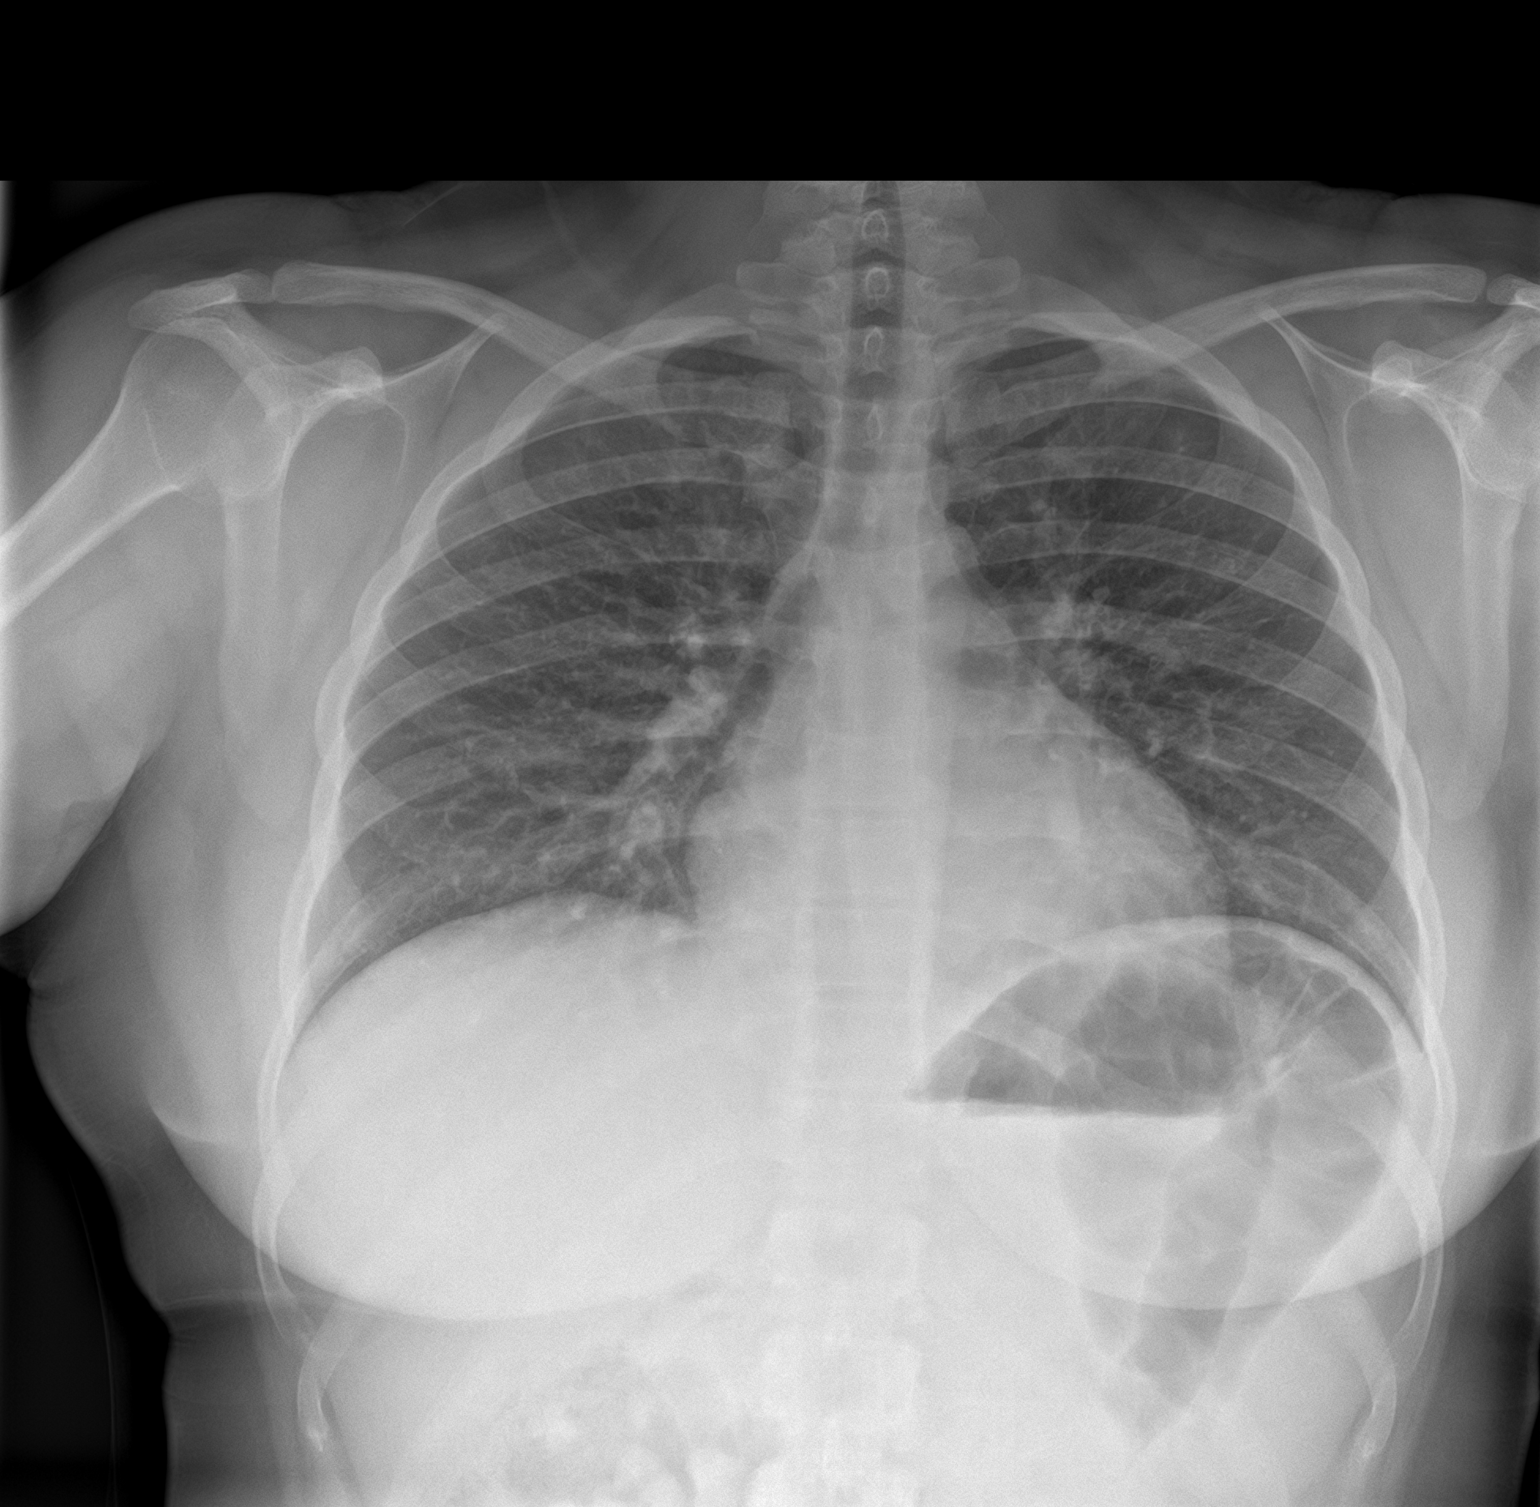

[chest lat]
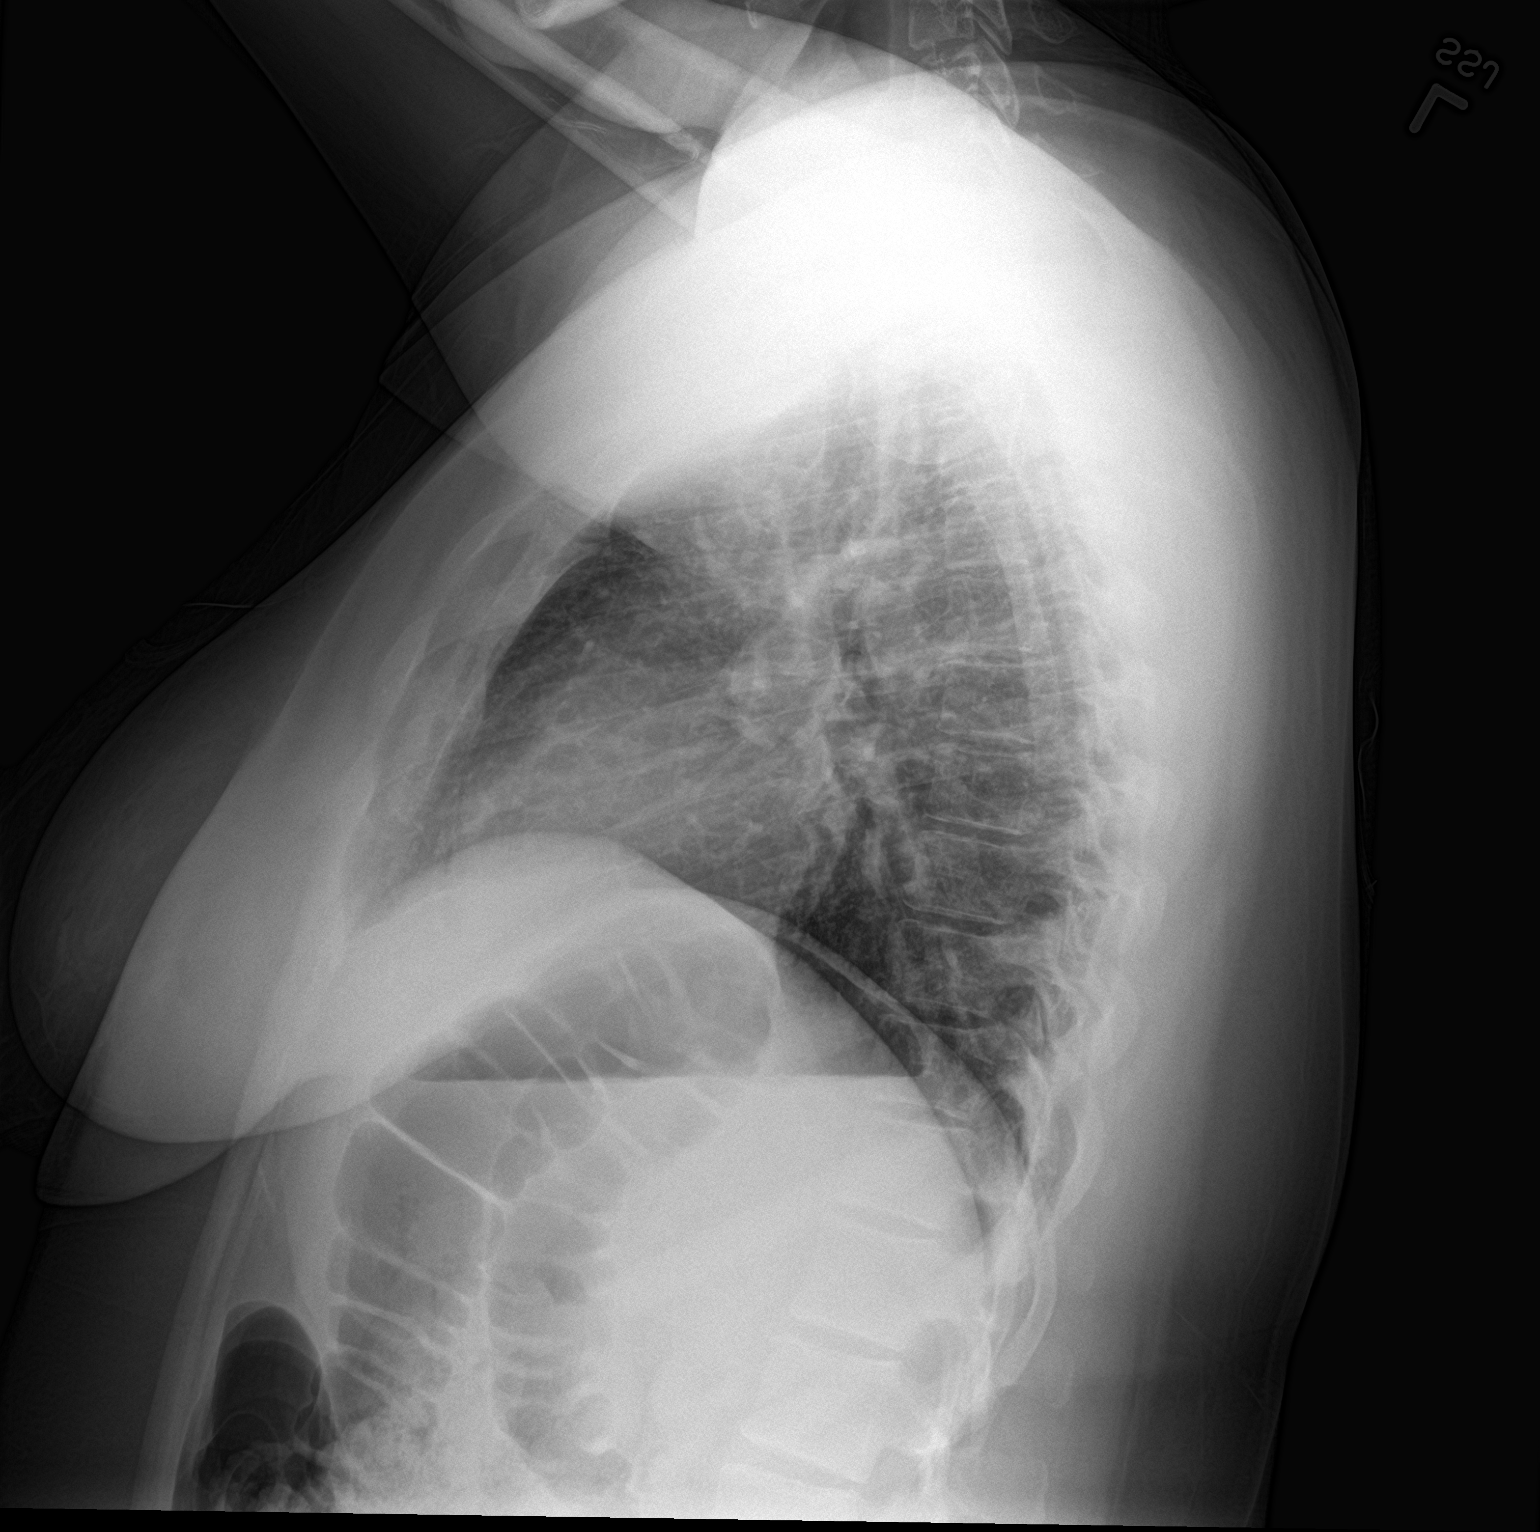

[2 of 2 positions shown; findings below may reference images not displayed]

FINDINGS: Somewhat low lung volumes. Normal cardiac size and mediastinal
contours. Visualized tracheal air column is within normal limits. No
pneumothorax, pleural effusion or confluent pulmonary opacity. But
there is mild increased reticulonodular density in both lungs.

No osseous abnormality identified. Negative visible bowel gas
pattern.
IMPRESSION: Somewhat low lung volumes with mild bilateral pulmonary
reticulonodular density suspicious for viral/atypical respiratory
infection. No consolidation or pleural effusion.

## 2022-01-07 NOTE — Progress Notes (Signed)
Subjective:    Miranda Prince is a 21 y.o. female who presents for evaluation of amenorrhea. She believes she could be pregnant. Patient is ambivalent about pregnancy.  Last period was normal.   Patient's last menstrual period was 11/19/2021. The following portions of the patient's history were reviewed and updated as appropriate: allergies, current medications, past family history, past medical history, past social history, past surgical history, and problem list.   Lab Review Urine HCG: positive    Assessment:    Absence of menstruation.     Plan:    Pregnancy Test:  Positive: EDC: 08/26/22. Briefly discussed positive results and sent to check out for scheduling for New OB appointments.

## 2022-01-08 ENCOUNTER — Ambulatory Visit (INDEPENDENT_AMBULATORY_CARE_PROVIDER_SITE_OTHER): Payer: Medicaid Other

## 2022-01-08 VITALS — Wt 166.0 lb

## 2022-01-08 DIAGNOSIS — Z3480 Encounter for supervision of other normal pregnancy, unspecified trimester: Secondary | ICD-10-CM

## 2022-01-08 DIAGNOSIS — Z369 Encounter for antenatal screening, unspecified: Secondary | ICD-10-CM

## 2022-01-08 DIAGNOSIS — Z3689 Encounter for other specified antenatal screening: Secondary | ICD-10-CM

## 2022-01-08 HISTORY — DX: Encounter for supervision of other normal pregnancy, unspecified trimester: Z34.80

## 2022-01-08 NOTE — Progress Notes (Signed)
New OB Intake  I connected with  Miranda Prince on 01/08/22 at 10:15 AM EDT by telephone Video Visit and verified that I am speaking with the correct person using two identifiers. Nurse is located at [employer] and pt is located at work.  I explained I am completing New OB Intake today. We discussed her EDD of 08/26/2022 that is based on LMP of 11/19/2021. Pt is G4/P2012. I reviewed her allergies, medications, Medical/Surgical/OB history, and appropriate screenings. Based on history, this is a/an pregnancy uncomplicated .   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 tylenol q6hrs while awake.   Delivery Plans:  Plans to deliver at [facility].  Anatomy US Explained first scheduled [place] will be around 20 weeks.   Labs Discussed  genetic screening with patient. Patient undecided about genetic testing to be drawn at or after 10 weeks. Discussed possible labs to be drawn at new OB appointment.  COVID Vaccine Patient has not had COVID vaccine.   Social Determinants of Health Food Insecurity: denies food insecurity Transportation: Patient denies transportation needs. Childcare: Discussed no children allowed at ultrasound appointments.   First visit review I reviewed new OB appt with pt. I explained she will have ob bloodwork and pap smear/pelvic exam if indicated. Explained pt will be seen by Dr. Jeannie Fend at first visit; encounter routed to appropriate provider.   Cleophas Dunker, [facility] 01/08/2022  10:36 AM

## 2022-01-15 ENCOUNTER — Other Ambulatory Visit: Payer: Medicaid Other

## 2022-01-15 ENCOUNTER — Other Ambulatory Visit: Payer: Self-pay

## 2022-01-15 DIAGNOSIS — Z369 Encounter for antenatal screening, unspecified: Secondary | ICD-10-CM | POA: Diagnosis not present

## 2022-01-15 DIAGNOSIS — Z3480 Encounter for supervision of other normal pregnancy, unspecified trimester: Secondary | ICD-10-CM | POA: Diagnosis not present

## 2022-01-16 DIAGNOSIS — Z419 Encounter for procedure for purposes other than remedying health state, unspecified: Secondary | ICD-10-CM | POA: Diagnosis not present

## 2022-01-16 LAB — CBC/D/PLT+RPR+RH+ABO+RUBIGG...
Antibody Screen: NEGATIVE
Basophils Absolute: 0.1 10*3/uL (ref 0.0–0.2)
Basos: 1 %
EOS (ABSOLUTE): 0.2 10*3/uL (ref 0.0–0.4)
Eos: 3 %
HCV Ab: NONREACTIVE
HIV Screen 4th Generation wRfx: NONREACTIVE
Hematocrit: 39.4 % (ref 34.0–46.6)
Hemoglobin: 12.8 g/dL (ref 11.1–15.9)
Hepatitis B Surface Ag: NEGATIVE
Immature Grans (Abs): 0 10*3/uL (ref 0.0–0.1)
Immature Granulocytes: 0 %
Lymphocytes Absolute: 1.8 10*3/uL (ref 0.7–3.1)
Lymphs: 24 %
MCH: 27.2 pg (ref 26.6–33.0)
MCHC: 32.5 g/dL (ref 31.5–35.7)
MCV: 84 fL (ref 79–97)
Monocytes Absolute: 0.5 10*3/uL (ref 0.1–0.9)
Monocytes: 7 %
Neutrophils Absolute: 4.9 10*3/uL (ref 1.4–7.0)
Neutrophils: 65 %
Platelets: 322 10*3/uL (ref 150–450)
RBC: 4.7 x10E6/uL (ref 3.77–5.28)
RDW: 13.1 % (ref 11.7–15.4)
RPR Ser Ql: NONREACTIVE
Rh Factor: POSITIVE
Rubella Antibodies, IGG: 7.47 index (ref >0.99)
Varicella zoster IgG: 2236 index (ref >165)
WBC: 7.6 10*3/uL (ref 3.4–10.8)

## 2022-01-16 LAB — HCV INTERPRETATION

## 2022-01-23 ENCOUNTER — Encounter: Payer: Medicaid Other | Admitting: Obstetrics and Gynecology

## 2022-01-30 ENCOUNTER — Ambulatory Visit (INDEPENDENT_AMBULATORY_CARE_PROVIDER_SITE_OTHER): Payer: Medicaid Other

## 2022-01-30 DIAGNOSIS — Z3480 Encounter for supervision of other normal pregnancy, unspecified trimester: Secondary | ICD-10-CM

## 2022-01-30 DIAGNOSIS — Z3687 Encounter for antenatal screening for uncertain dates: Secondary | ICD-10-CM | POA: Diagnosis not present

## 2022-01-30 DIAGNOSIS — Z369 Encounter for antenatal screening, unspecified: Secondary | ICD-10-CM

## 2022-01-30 DIAGNOSIS — Z3A10 10 weeks gestation of pregnancy: Secondary | ICD-10-CM | POA: Diagnosis not present

## 2022-02-04 ENCOUNTER — Encounter: Payer: Medicaid Other | Admitting: Certified Nurse Midwife

## 2022-02-11 ENCOUNTER — Other Ambulatory Visit (HOSPITAL_COMMUNITY)
Admission: RE | Admit: 2022-02-11 | Discharge: 2022-02-11 | Disposition: A | Discharge status: Home / Self Care | Payer: Medicaid Other | Source: Ambulatory Visit | Attending: Obstetrics and Gynecology | Admitting: Obstetrics and Gynecology

## 2022-02-11 ENCOUNTER — Encounter: Payer: Self-pay | Admitting: Certified Nurse Midwife

## 2022-02-11 ENCOUNTER — Ambulatory Visit (INDEPENDENT_AMBULATORY_CARE_PROVIDER_SITE_OTHER): Payer: Medicaid Other | Admitting: Certified Nurse Midwife

## 2022-02-11 VITALS — BP 100/67 | HR 86 | Wt 166.0 lb

## 2022-02-11 DIAGNOSIS — Z3A12 12 weeks gestation of pregnancy: Secondary | ICD-10-CM | POA: Diagnosis not present

## 2022-02-11 DIAGNOSIS — Z3481 Encounter for supervision of other normal pregnancy, first trimester: Secondary | ICD-10-CM

## 2022-02-11 DIAGNOSIS — Z113 Encounter for screening for infections with a predominantly sexual mode of transmission: Secondary | ICD-10-CM | POA: Diagnosis not present

## 2022-02-11 DIAGNOSIS — Z3491 Encounter for supervision of normal pregnancy, unspecified, first trimester: Secondary | ICD-10-CM | POA: Diagnosis not present

## 2022-02-11 NOTE — Patient Instructions (Signed)
Prenatal Care Prenatal care is health care during pregnancy. It helps you and your unborn baby (fetus) stay as healthy as possible. Prenatal care may be provided by a midwife, a family practice doctor, a mid-level practitioner (nurse practitioner or physician assistant), or a childbirth and pregnancy doctor (obstetrician). How does this affect me? During pregnancy, you will be closely monitored for any new conditions that might develop. To lower your risk of pregnancy complications, you and your health care provider will talk about any underlying conditions you have. How does this affect my baby? Early and consistent prenatal care increases the chance that your baby will be healthy during pregnancy. Prenatal care lowers the risk that your baby will be: Born early (prematurely). Smaller than expected at birth (small for gestational age). What can I expect at the first prenatal care visit? Your first prenatal care visit will likely be the longest. You should schedule your first prenatal care visit as soon as you know that you are pregnant. Your first visit is a good time to talk about any questions or concerns you have about pregnancy. Medical history At your visit, you and your health care provider will talk about your medical history, including: Any past pregnancies. Your family's medical history. Medical history of the baby's father. Any long-term (chronic) health conditions you have and how you manage them. Any surgeries or procedures you have had. Any current over-the-counter or prescription medicines, herbs, or supplements that you are taking. Other factors that could pose a risk to your baby, including: Exposure to harmful chemicals or radiation at work or at home. Any substance use, including tobacco, alcohol, and drug use. Your home setting and your stress levels, including: Exposure to abuse or violence. Household financial strain. Your daily health habits, including diet and  exercise. Tests and screenings Your health care provider will: Measure your weight, height, and blood pressure. Do a physical exam, including a pelvic and breast exam. Perform blood tests and urine tests to check for: Urinary tract infection. Sexually transmitted infections (STIs). Low iron levels in your blood (anemia). Blood type and certain proteins on red blood cells (Rh antibodies). Infections and immunity to viruses, such as hepatitis B and rubella. HIV (human immunodeficiency virus). Discuss your options for genetic screening. Tips about staying healthy Your health care provider will also give you information about how to keep yourself and your baby healthy, including: Nutrition and taking vitamins. Physical activity. How to manage pregnancy symptoms such as nausea and vomiting (morning sickness). Infections and substances that may be harmful to your baby and how to avoid them. Food safety. Dental care. Working. Travel. Warning signs to watch for and when to call your health care provider. How often will I have prenatal care visits? After your first prenatal care visit, you will have regular visits throughout your pregnancy. The visit schedule is often as follows: Up to week 28 of pregnancy: once every 4 weeks. 28-36 weeks: once every 2 weeks. After 36 weeks: every week until delivery. Some women may have visits more or less often depending on any underlying health conditions and the health of the baby. Keep all follow-up and prenatal care visits. This is important. What happens during routine prenatal care visits? Your health care provider will: Measure your weight and blood pressure. Check for fetal heart sounds. Measure the height of your uterus in your abdomen (fundal height). This may be measured starting around week 20 of pregnancy. Check the position of your baby inside your uterus. Ask questions   about your diet, sleeping patterns, and whether you can feel the baby  move. Review warning signs to watch for and signs of labor. Ask about any pregnancy symptoms you are having and how you are dealing with them. Symptoms may include: Headaches. Nausea and vomiting. Vaginal discharge. Swelling. Fatigue. Constipation. Changes in your vision. Feeling persistently sad or anxious. Any discomfort, including back or pelvic pain. Bleeding or spotting. Make a list of questions to ask your health care provider at your routine visits. What tests might I have during prenatal care visits? You may have blood, urine, and imaging tests throughout your pregnancy, such as: Urine tests to check for glucose, protein, or signs of infection. Glucose tests to check for a form of diabetes that can develop during pregnancy (gestational diabetes mellitus). This is usually done around week 24 of pregnancy. Ultrasounds to check your baby's growth and development, to check for birth defects, and to check your baby's well-being. These can also help to decide when you should deliver your baby. A test to check for group B strep (GBS) infection. This is usually done around week 36 of pregnancy. Genetic testing. This may include blood, fluid, or tissue sampling, or imaging tests, such as an ultrasound. Some genetic tests are done during the first trimester and some are done during the second trimester. What else can I expect during prenatal care visits? Your health care provider may recommend getting certain vaccines during pregnancy. These may include: A yearly flu shot (annual influenza vaccine). This is especially important if you will be pregnant during flu season. Tdap (tetanus, diphtheria, pertussis) vaccine. Getting this vaccine during pregnancy can protect your baby from whooping cough (pertussis) after birth. This vaccine may be recommended between weeks 27 and 36 of pregnancy. A COVID-19 vaccine. Later in your pregnancy, your health care provider may give you information  about: Childbirth and breastfeeding classes. Choosing a health care provider for your baby. Umbilical cord banking. Breastfeeding. Birth control after your baby is born. The hospital labor and delivery unit and how to set up a tour. Registering at the hospital before you go into labor. Where to find more information Office on Women's Health: womenshealth.gov American Pregnancy Association: americanpregnancy.org March of Dimes: marchofdimes.org Summary Prenatal care helps you and your baby stay as healthy as possible during pregnancy. Your first prenatal care visit will most likely be the longest. You will have visits and tests throughout your pregnancy to monitor your health and your baby's health. Bring a list of questions to your visits to ask your health care provider. Make sure to keep all follow-up and prenatal care visits. This information is not intended to replace advice given to you by your health care provider. Make sure you discuss any questions you have with your health care provider. Document Revised: 12/16/2019 Document Reviewed: 12/16/2019 Elsevier Patient Education  2023 Elsevier Inc.  

## 2022-02-11 NOTE — Progress Notes (Signed)
NEW OB HISTORY AND PHYSICAL  SUBJECTIVE:       Miranda Prince is a 21 y.o. [phone] female, Patient's last menstrual period was 11/19/2021 (exact date)., Estimated Date of Delivery: 08/26/22, [redacted]w[redacted]d, presents today for establishment of Prenatal Care. She has no unusual complaints  Social Relationship:Female partner Live: with partner and children Work: Lexicographer houses Exercise- 1 x wk Smoking/drugs/alcohol use: denies use    Gynecologic History Patient's last menstrual period was 11/19/2021 (exact date). Normal Contraception: none Last Pap: 01/19/2020. Results were: normal  Obstetric History OB History  Gravida Para Term Preterm AB Living  4 2 2   1 2   SAB IAB Ectopic Multiple Live Births  1     0 2    # Outcome Date GA Lbr Len/2nd Weight Sex Delivery Anes PTL Lv  4 Current           3 Term 07/12/19 [redacted]w[redacted]d / 00:30 6 lb 10.5 oz (3.02 kg) F Vag-Spont EPI  LIV  2 SAB 07/2017     SAB     1 Term 04/08/17 [redacted]w[redacted]d / 00:26 7 lb 2.8 oz (3.255 kg) F Vag-Spont EPI  LIV    Past Medical History:  Diagnosis Date   Headache     History reviewed. No pertinent surgical history.  Current Outpatient Medications on File Prior to Visit  Medication Sig Dispense Refill   acetaminophen (TYLENOL) 325 MG tablet Take 2 tablets (650 mg total) by mouth every 4 (four) hours as needed (for pain scale < 4). 30 tablet 0   Prenatal MV & Min w/FA-DHA (PRENATAL ADULT GUMMY/DHA/FA PO) Take by mouth.     sertraline (ZOLOFT) 50 MG tablet Take 1 tablet (50 mg total) by mouth daily. 90 tablet 3   No current facility-administered medications on file prior to visit.    No Known Allergies  Social History   Socioeconomic History   Marital status: Married    Spouse name: [redacted]w[redacted]d   Number of children: 2   Years of education: 12   Highest education level: Not on file  Occupational History   Occupation: cleans houses c a [place]  Tobacco Use   Smoking status: Never   Smokeless tobacco: Never  Vaping Use    Vaping Use: Former  Substance and Sexual Activity   Alcohol use: No   Drug use: No   Sexual activity: Yes    Partners: Male    Birth control/protection: None  Other Topics Concern   Not on file  Social History Narrative   Right handed    Lives with family    Just had a baby   Social Determinants of Health   Financial Resource Strain: Medium Risk (01/08/2022)   Overall Financial Resource Strain (CARDIA)    Difficulty of Paying Living Expenses: Somewhat hard  Food Insecurity: No Food Insecurity (01/08/2022)   Hunger Vital Sign    Worried About Running Out of Food in the Last Year: Never true    Ran Out of Food in the Last Year: Never true  Transportation Needs: No Transportation Needs (01/08/2022)   PRAPARE - 01/10/2022 (Medical): No    Lack of Transportation (Non-Medical): No  Physical Activity: Insufficiently Active (01/08/2022)   Exercise Vital Sign    Days of Exercise per Week: 1 day    Minutes of Exercise per Session: 20 min  Stress: No Stress Concern Present (01/08/2022)   01/10/2022 of Occupational Health - Occupational Stress Questionnaire  Feeling of Stress : Not at all  Social Connections: Moderately Integrated (01/08/2022)   Social Connection and Isolation Panel [NHANES]    Frequency of Communication with Friends and Family: More than three times a week    Frequency of Social Gatherings with Friends and Family: Twice a week    Attends Religious Services: More than 4 times per year    Active Member of [employer] or Organizations: No    Attends Banker Meetings: Never    Marital Status: Married  Catering manager Violence: Not At Risk (01/08/2022)   Humiliation, Afraid, Rape, and Kick questionnaire    Fear of Current or Ex-Partner: No    Emotionally Abused: No    Physically Abused: No    Sexually Abused: No    Family History  Problem Relation Age of Onset   Diabetes Mother    Seizures Maternal Grandmother     Stroke Paternal Grandfather    Diabetes Maternal Uncle    Depression Paternal Aunt    Asthma Paternal Aunt    COPD Paternal Aunt    Anxiety disorder Paternal Aunt    Diabetes Other    Cancer Other     The following portions of the patient's history were reviewed and updated as appropriate: allergies, current medications, past OB history, past medical history, past surgical history, past family history, past social history, and problem list.    OBJECTIVE: Initial Physical Exam (New OB)  GENERAL APPEARANCE: alert, well appearing, in no apparent distress, oriented to person, place and time HEAD: normocephalic, atraumatic MOUTH: mucous membranes moist, pharynx normal without lesions THYROID: no thyromegaly or masses present BREASTS: no masses noted, no significant tenderness, no palpable axillary nodes, no skin changes LUNGS: clear to auscultation, no wheezes, rales or rhonchi, symmetric air entry HEART: regular rate and rhythm, no murmurs ABDOMEN: soft, nontender, nondistended, no abnormal masses, no epigastric pain, obese, and FHT present EXTREMITIES: no redness or tenderness in the calves or thighs, no edema, no limitation in range of motion, intact peripheral pulses SKIN: normal coloration and turgor, no rashes LYMPH NODES: no adenopathy palpable NEUROLOGIC: alert, oriented, normal speech, no focal findings or movement disorder noted  PELVIC EXAM Deferred pap not due , tested pelvis   ASSESSMENT: Normal pregnancy  PLAN: New OB counseling: The patient has been given an overview regarding routine prenatal care. Recommendations regarding diet, weight gain, and exercise in pregnancy were given. Prenatal testing, optional genetic testing, carrier screening , and ultrasound use in pregnancy were reviewed. Pt declines genetic testing. Pt currently takes zoloft for Migraine treatment. Discussed risks and benefits. Mother to baby fact sheet given.  Benefits of Breast Feeding were  discussed. The patient is encouraged to consider nursing her baby post partum.  Doreene Burke, CNM

## 2022-02-11 NOTE — Addendum Note (Signed)
Addended by: Fonda Kinder on: 02/11/2022 03:26 PM   Modules accepted: Orders

## 2022-02-13 LAB — URINE CYTOLOGY ANCILLARY ONLY
Chlamydia: NEGATIVE
Comment: NEGATIVE
Comment: NORMAL
Neisseria Gonorrhea: NEGATIVE

## 2022-02-13 LAB — URINE CULTURE

## 2022-02-15 DIAGNOSIS — Z419 Encounter for procedure for purposes other than remedying health state, unspecified: Secondary | ICD-10-CM | POA: Diagnosis not present

## 2022-03-12 ENCOUNTER — Encounter: Payer: Self-pay | Admitting: Obstetrics and Gynecology

## 2022-03-12 ENCOUNTER — Ambulatory Visit (INDEPENDENT_AMBULATORY_CARE_PROVIDER_SITE_OTHER): Payer: Medicaid Other | Admitting: Obstetrics and Gynecology

## 2022-03-12 VITALS — BP 114/75 | HR 96 | Wt 173.8 lb

## 2022-03-12 DIAGNOSIS — Z3689 Encounter for other specified antenatal screening: Secondary | ICD-10-CM

## 2022-03-12 DIAGNOSIS — Z1379 Encounter for other screening for genetic and chromosomal anomalies: Secondary | ICD-10-CM

## 2022-03-12 DIAGNOSIS — Z3482 Encounter for supervision of other normal pregnancy, second trimester: Secondary | ICD-10-CM

## 2022-03-12 DIAGNOSIS — Z3A16 16 weeks gestation of pregnancy: Secondary | ICD-10-CM

## 2022-03-12 LAB — POCT URINALYSIS DIPSTICK OB
Bilirubin, UA: NEGATIVE
Blood, UA: NEGATIVE
Glucose, UA: NEGATIVE
Ketones, UA: NEGATIVE
Leukocytes, UA: NEGATIVE
Nitrite, UA: NEGATIVE
POC,PROTEIN,UA: NEGATIVE
Spec Grav, UA: 1.01 (ref 1.010–1.025)
Urobilinogen, UA: 0.2 E.U./dL
pH, UA: 7.5 (ref 5.0–8.0)

## 2022-03-12 NOTE — Progress Notes (Signed)
ROB: Has occasional round ligament pain but otherwise no problems.  Declines MaterniT 21 and declines AFP.  Anatomy ultrasound with next visit.

## 2022-03-12 NOTE — Progress Notes (Signed)
ROB. Patient states feeling well occasionally experiencing round ligament pain. AFP declined. Anatomy ultrasound ordered. Patient states no questions or concerns at this time.

## 2022-03-18 DIAGNOSIS — Z419 Encounter for procedure for purposes other than remedying health state, unspecified: Secondary | ICD-10-CM | POA: Diagnosis not present

## 2022-03-18 NOTE — L&D Delivery Note (Signed)
Delivery Note   Sayoko Korman is a 22 y.o. [id number] at [redacted]w[redacted]d Estimated Date of Delivery: 08/26/22  PRE-OPERATIVE DIAGNOSIS:  1) [redacted]w[redacted]d pregnancy.    POST-OPERATIVE DIAGNOSIS:  1) [redacted]w[redacted]d pregnancy s/p Vaginal, Spontaneous  2) Retained amniotic membrane  Delivery Type: Vaginal, Spontaneous    Delivery Anesthesia: Epidural   Labor Complications:  None    ESTIMATED BLOOD LOSS: 500  ml    FINDINGS:   1) female infant, Apgar scores of 7   at 1 minute and 9   at 5 minutes and a birthweight pending per protocol.     SPECIMENS:   PLACENTA:   Appearance: Intact    Removal: Spontaneous      Disposition:  Discarded   Retained amniotic membrane manually removed with one sweep  CORD BLOOD: Collected  DISPOSITION:  Infant left in stable condition in the delivery room, with L&D personnel and mother,  NARRATIVE SUMMARY: Labor course:  Katrinia Lindenbaum is a [id number] at [redacted]w[redacted]d who presented to Labor & Delivery for induction of labor due to postdates gestation. Her initial cervical exam was 3/50/-3. Labor proceeded with pitocin for induction. Spontaneous rupture of membranes for clear fluid occurred at 1858. She received an epidural for pain management, it was turned off for a period of time due to hypotension. With excellent maternal pushing effort, she birthed a viable female infant at 29 . There was no nuchal cord. After delivery of head despite maternal effort and gentle traction shoulder did not delivery, shoulder dystocia was identified and with assistance of RNs patient placed in [place] with delivery of shoulders in less than 60s from head to body. Shoulders delivered in transverse plane. The infant was placed skin-to-skin with mother. The cord was doubly clamped and cut when pulsations ceased. The placenta delivered spontaneously with trailing membrane. Misoprostol placed PR given retained membrane despite traction with Ring forceps. At ~68m after delivery uterine sweep performed  given increased bleeding and retained membrane manually removed. Ancef 2g IV ordered for one dose. Placenta & membrane examined and noted to be intact with a 3VC. A perineal and vaginal examination was performed. Episiotomy/Lacerations: None  The patient tolerated this well. Mother and baby were left in stable condition.   Dominica Severin, CNM 09/04/2022 3:40 AM

## 2022-04-09 ENCOUNTER — Ambulatory Visit (INDEPENDENT_AMBULATORY_CARE_PROVIDER_SITE_OTHER): Payer: Medicaid Other | Admitting: Obstetrics

## 2022-04-09 VITALS — BP 112/73 | HR 96 | Wt 175.0 lb

## 2022-04-09 DIAGNOSIS — Z3482 Encounter for supervision of other normal pregnancy, second trimester: Secondary | ICD-10-CM

## 2022-04-09 DIAGNOSIS — Z3A20 20 weeks gestation of pregnancy: Secondary | ICD-10-CM

## 2022-04-09 LAB — POCT URINALYSIS DIPSTICK OB
Bilirubin, UA: NEGATIVE
Blood, UA: NEGATIVE
Glucose, UA: NEGATIVE
Ketones, UA: NEGATIVE
Leukocytes, UA: NEGATIVE
Nitrite, UA: NEGATIVE
POC,PROTEIN,UA: NEGATIVE
Spec Grav, UA: 1.02 (ref 1.010–1.025)
Urobilinogen, UA: 0.2 E.U./dL
pH, UA: 6 (ref 5.0–8.0)

## 2022-04-09 NOTE — Addendum Note (Signed)
Addended by: Landis Gandy on: 04/09/2022 02:25 PM   Modules accepted: Orders

## 2022-04-09 NOTE — Progress Notes (Signed)
ROB at [redacted]w[redacted]d. Active baby. No concerns today. Rillie feels her mood has been stable. She has been eating well and staying active. Anatomy US is scheduled for tomorrow. Plans an epidural for birth. RTC in 4 weeks.  Lurlean Horns, CNM

## 2022-04-10 ENCOUNTER — Ambulatory Visit
Admission: RE | Admit: 2022-04-10 | Discharge: 2022-04-10 | Disposition: A | Discharge status: Home / Self Care | Payer: Medicaid Other | Source: Ambulatory Visit | Attending: Obstetrics and Gynecology | Admitting: Obstetrics and Gynecology

## 2022-04-10 DIAGNOSIS — Z3482 Encounter for supervision of other normal pregnancy, second trimester: Secondary | ICD-10-CM | POA: Diagnosis not present

## 2022-04-10 DIAGNOSIS — Z3689 Encounter for other specified antenatal screening: Secondary | ICD-10-CM | POA: Diagnosis not present

## 2022-04-10 DIAGNOSIS — Z3A20 20 weeks gestation of pregnancy: Secondary | ICD-10-CM | POA: Insufficient documentation

## 2022-04-18 DIAGNOSIS — Z419 Encounter for procedure for purposes other than remedying health state, unspecified: Secondary | ICD-10-CM | POA: Diagnosis not present

## 2022-05-07 ENCOUNTER — Encounter: Payer: Medicaid Other | Admitting: Advanced Practice Midwife

## 2022-05-08 ENCOUNTER — Ambulatory Visit (INDEPENDENT_AMBULATORY_CARE_PROVIDER_SITE_OTHER): Payer: Medicaid Other | Admitting: Obstetrics

## 2022-05-08 ENCOUNTER — Encounter: Payer: Self-pay | Admitting: Obstetrics

## 2022-05-08 VITALS — BP 115/69 | HR 111 | Wt 184.5 lb

## 2022-05-08 DIAGNOSIS — Z3482 Encounter for supervision of other normal pregnancy, second trimester: Secondary | ICD-10-CM

## 2022-05-08 DIAGNOSIS — Z3A24 24 weeks gestation of pregnancy: Secondary | ICD-10-CM

## 2022-05-08 DIAGNOSIS — Z3480 Encounter for supervision of other normal pregnancy, unspecified trimester: Secondary | ICD-10-CM

## 2022-05-08 NOTE — Progress Notes (Signed)
Routine Prenatal Care Visit  Subjective  Miranda Prince is a 22 y.o. [phone] at 51w2dbeing seen today for ongoing prenatal care.  She is currently monitored for the following issues for this low-risk pregnancy and has Migraine without aura and without status migrainosus, not intractable; Vertiginous migraine; E-coli UTI; and Supervision of other normal pregnancy, antepartum on their problem list.  ----------------------------------------------------------------------------------- Patient reports no complaints.  She is having a boy- the name is still a secret Contractions: Not present. Vag. Bleeding: None.  Movement: Present. Leaking Fluid denies.  ----------------------------------------------------------------------------------- The following portions of the patient's history were reviewed and updated as appropriate: allergies, current medications, past family history, past medical history, past social history, past surgical history and problem list. Problem list updated.  Objective  Blood pressure 115/69, pulse (!) 111, weight 184 lb 8 oz (83.7 kg), last menstrual period 11/19/2021, unknown if currently breastfeeding. Pregravid weight 160 lb (72.6 kg) Total Weight Gain 24 lb 8 oz (11.1 kg) Urinalysis: Urine Protein    Urine Glucose    Fetal Status:     Movement: Present     General:  Alert, oriented and cooperative. Patient is in no acute distress.  Skin: Skin is warm and dry. No rash noted.   Cardiovascular: Normal heart rate noted  Respiratory: Normal respiratory effort, no problems with respiration noted  Abdomen: Soft, gravid, appropriate for gestational age. Pain/Pressure: Absent     Pelvic:  Cervical exam deferred        Extremities: Normal range of motion.  Edema: None  Mental Status: Normal mood and affect. Normal behavior. Normal judgment and thought content.   Assessment   22y.o. [id number] 283w2dy  08/26/2022, by Last Menstrual Period presenting for routine prenatal  visit  Plan   fourth Problems (from 01/08/22 to present)     No problems associated with this episode.        Preterm labor symptoms and general obstetric precautions including but not limited to vaginal bleeding, contractions, leaking of fluid and fetal movement were reviewed in detail with the patient. Please refer to After Visit Summary for other counseling recommendations.  She will have her 28 week labs next visit. Return in about 4 weeks (around 06/05/2022) for return OB, 28 week labs.  MaImagene RichesCNM  05/08/2022 4:35 PM

## 2022-05-17 DIAGNOSIS — Z419 Encounter for procedure for purposes other than remedying health state, unspecified: Secondary | ICD-10-CM | POA: Diagnosis not present

## 2022-05-30 ENCOUNTER — Other Ambulatory Visit: Payer: Medicaid Other

## 2022-05-30 ENCOUNTER — Encounter: Payer: Medicaid Other | Admitting: Obstetrics & Gynecology

## 2022-06-06 ENCOUNTER — Other Ambulatory Visit: Payer: Medicaid Other

## 2022-06-06 ENCOUNTER — Ambulatory Visit (INDEPENDENT_AMBULATORY_CARE_PROVIDER_SITE_OTHER): Payer: Medicaid Other | Admitting: Obstetrics

## 2022-06-06 ENCOUNTER — Encounter: Payer: Self-pay | Admitting: Obstetrics

## 2022-06-06 VITALS — BP 106/66 | HR 103 | Wt 192.0 lb

## 2022-06-06 DIAGNOSIS — Z23 Encounter for immunization: Secondary | ICD-10-CM

## 2022-06-06 DIAGNOSIS — Z3483 Encounter for supervision of other normal pregnancy, third trimester: Secondary | ICD-10-CM

## 2022-06-06 DIAGNOSIS — Z3480 Encounter for supervision of other normal pregnancy, unspecified trimester: Secondary | ICD-10-CM

## 2022-06-06 DIAGNOSIS — Z3A28 28 weeks gestation of pregnancy: Secondary | ICD-10-CM

## 2022-06-06 LAB — POCT URINALYSIS DIPSTICK OB
Bilirubin, UA: NEGATIVE
Blood, UA: NEGATIVE
Glucose, UA: NEGATIVE
Ketones, UA: NEGATIVE
Leukocytes, UA: NEGATIVE
Nitrite, UA: NEGATIVE
POC,PROTEIN,UA: NEGATIVE
Spec Grav, UA: 1.02 (ref 1.010–1.025)
Urobilinogen, UA: 0.2 E.U./dL
pH, UA: 6.5 (ref 5.0–8.0)

## 2022-06-06 NOTE — Addendum Note (Signed)
Addended by: Landis Gandy on: 06/06/2022 04:45 PM   Modules accepted: Orders

## 2022-06-06 NOTE — Progress Notes (Signed)
ROB at [redacted]w[redacted]d. Active baby. Denies ctx, LOF, and vaginal bleeding. Reviewed fetal kick counts and s/s of PTL. Discussed making a birth plan. Will likely want epidural. Birth plan handout given. Undecided about contraception, but probably NFP. Handout given. 1-hour glucose, CBC, RPR, TDaP today. RTC in 2 weeks.  Lurlean Horns, CNM

## 2022-06-07 ENCOUNTER — Encounter: Payer: Self-pay | Admitting: Obstetrics

## 2022-06-07 LAB — 28 WEEK RH+PANEL
Basophils Absolute: 0.1 10*3/uL (ref 0.0–0.2)
Basos: 1 %
EOS (ABSOLUTE): 0.2 10*3/uL (ref 0.0–0.4)
Eos: 2 %
Gestational Diabetes Screen: 105 mg/dL (ref 70–139)
HIV Screen 4th Generation wRfx: NONREACTIVE
Hematocrit: 31.5 % — ABNORMAL LOW (ref 34.0–46.6)
Hemoglobin: 10.2 g/dL — ABNORMAL LOW (ref 11.1–15.9)
Immature Grans (Abs): 0.2 10*3/uL — ABNORMAL HIGH (ref 0.0–0.1)
Immature Granulocytes: 2 %
Lymphocytes Absolute: 1.5 10*3/uL (ref 0.7–3.1)
Lymphs: 13 %
MCH: 27.3 pg (ref 26.6–33.0)
MCHC: 32.4 g/dL (ref 31.5–35.7)
MCV: 84 fL (ref 79–97)
Monocytes Absolute: 0.7 10*3/uL (ref 0.1–0.9)
Monocytes: 6 %
Neutrophils Absolute: 8.5 10*3/uL — ABNORMAL HIGH (ref 1.4–7.0)
Neutrophils: 76 %
Platelets: 258 10*3/uL (ref 150–450)
RBC: 3.74 x10E6/uL — ABNORMAL LOW (ref 3.77–5.28)
RDW: 13.1 % (ref 11.7–15.4)
RPR Ser Ql: NONREACTIVE
WBC: 11.1 10*3/uL — ABNORMAL HIGH (ref 3.4–10.8)

## 2022-06-17 DIAGNOSIS — Z419 Encounter for procedure for purposes other than remedying health state, unspecified: Secondary | ICD-10-CM | POA: Diagnosis not present

## 2022-06-19 ENCOUNTER — Ambulatory Visit (INDEPENDENT_AMBULATORY_CARE_PROVIDER_SITE_OTHER): Payer: Medicaid Other | Admitting: Licensed Practical Nurse

## 2022-06-19 ENCOUNTER — Encounter: Payer: Self-pay | Admitting: Licensed Practical Nurse

## 2022-06-19 VITALS — BP 112/75 | HR 111 | Wt 196.6 lb

## 2022-06-19 DIAGNOSIS — O26843 Uterine size-date discrepancy, third trimester: Secondary | ICD-10-CM

## 2022-06-19 DIAGNOSIS — Z3A30 30 weeks gestation of pregnancy: Secondary | ICD-10-CM

## 2022-06-19 DIAGNOSIS — Z3483 Encounter for supervision of other normal pregnancy, third trimester: Secondary | ICD-10-CM

## 2022-06-19 LAB — POCT URINALYSIS DIPSTICK
Bilirubin, UA: NEGATIVE
Blood, UA: NEGATIVE
Glucose, UA: NEGATIVE
Ketones, UA: NEGATIVE
Leukocytes, UA: NEGATIVE
Nitrite, UA: NEGATIVE
Protein, UA: NEGATIVE
Spec Grav, UA: 1.015 (ref 1.010–1.025)
Urobilinogen, UA: 0.2 E.U./dL
pH, UA: 6.5 (ref 5.0–8.0)

## 2022-06-19 NOTE — Progress Notes (Signed)
Routine Prenatal Care Visit  Subjective  Tanajia Bramlet is a 22 y.o. [phone] at [redacted]w[redacted]d being seen today for ongoing prenatal care.  She is currently monitored for the following issues for this low-risk pregnancy and has Migraine without aura and without status migrainosus, not intractable; Vertiginous migraine; E-coli UTI; and Supervision of other normal pregnancy, antepartum on their problem list.  ----------------------------------------------------------------------------------- Patient reports no complaints.  Feeling good, having a few third trimester discomforts (heartburn and B-H ctx's) that are manageable -has a 5 and almost 3 year at home, her in laws will watch them while in labor -pleased with previous birth experiences, hopes for a similar experience -Mood has been good.  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   Contractions: Irritability. Vag. Bleeding: None.  Movement: Present. Leaking Fluid denies.  ----------------------------------------------------------------------------------- The following portions of the patient's history were reviewed and updated as appropriate: allergies, current medications, past family history, past medical history, past social history, past surgical history and problem list. Problem list updated.  Objective  Blood pressure 112/75, pulse (!) 111, weight 196 lb 9.6 oz (89.2 kg), last menstrual period 11/19/2021, unknown if currently breastfeeding. Pregravid weight 160 lb (72.6 kg) Total Weight Gain 36 lb 9.6 oz (16.6 kg) Urinalysis: Urine Protein    Urine Glucose    Fetal Status: Fetal Heart Rate (bpm): 135 Fundal Height: 33 cm Movement: Present     General:  Alert, oriented and cooperative. Patient is in no acute distress.  Skin: Skin is warm and dry. No rash noted.   Cardiovascular: Normal heart rate noted  Respiratory: Normal respiratory effort, no  problems with respiration noted  Abdomen: Soft, gravid, appropriate for gestational age. Pain/Pressure: Absent     Pelvic:  Cervical exam deferred        Extremities: Normal range of motion.  Edema: None  Mental Status: Normal mood and affect. Normal behavior. Normal judgment and thought content.   Assessment   22 y.o. [id number] at [redacted]w[redacted]d by  08/26/2022, by Last Menstrual Period presenting for routine prenatal visit  Plan   fourth Problems (from 01/08/22 to present)     No problems associated with this episode.        Preterm labor symptoms and general obstetric precautions including but not limited to vaginal bleeding, contractions, leaking of fluid and fetal movement were reviewed in detail with the patient. Please refer to After Visit Summary for other counseling recommendations.   Return in about 2 weeks (around 07/03/2022) for ROB.  [place] ordered for S>D  Emrie Gayle, [facility]  06/19/22  3:12 PM

## 2022-07-01 ENCOUNTER — Ambulatory Visit (INDEPENDENT_AMBULATORY_CARE_PROVIDER_SITE_OTHER): Payer: Medicaid Other | Admitting: Obstetrics and Gynecology

## 2022-07-01 ENCOUNTER — Encounter: Payer: Self-pay | Admitting: Obstetrics and Gynecology

## 2022-07-01 VITALS — BP 103/67 | HR 114 | Wt 196.3 lb

## 2022-07-01 DIAGNOSIS — Z3483 Encounter for supervision of other normal pregnancy, third trimester: Secondary | ICD-10-CM

## 2022-07-01 DIAGNOSIS — Z3A32 32 weeks gestation of pregnancy: Secondary | ICD-10-CM

## 2022-07-01 LAB — POCT URINALYSIS DIPSTICK OB
Bilirubin, UA: NEGATIVE
Blood, UA: NEGATIVE
Glucose, UA: NEGATIVE
Ketones, UA: NEGATIVE
Leukocytes, UA: NEGATIVE
Nitrite, UA: NEGATIVE
POC,PROTEIN,UA: NEGATIVE
Spec Grav, UA: 1.02 (ref 1.010–1.025)
Urobilinogen, UA: 0.2 E.U./dL
pH, UA: 6 (ref 5.0–8.0)

## 2022-07-01 NOTE — Progress Notes (Signed)
ROB. Patient states daily fetal movement and occasional round ligament pains. She did not have a growth ultrasound yet.

## 2022-07-01 NOTE — Progress Notes (Signed)
ROB: She says she is feeling tired but generally well.  Reports daily fetal movement.  Size slightly greater than dates will follow this and decide in the future if she needs an ultrasound.  Her previous 1 hour GCT was normal.

## 2022-07-15 ENCOUNTER — Ambulatory Visit (INDEPENDENT_AMBULATORY_CARE_PROVIDER_SITE_OTHER): Payer: Medicaid Other | Admitting: Certified Nurse Midwife

## 2022-07-15 ENCOUNTER — Encounter: Payer: Self-pay | Admitting: Certified Nurse Midwife

## 2022-07-15 VITALS — BP 113/75 | HR 109 | Wt 199.4 lb

## 2022-07-15 DIAGNOSIS — Z3A34 34 weeks gestation of pregnancy: Secondary | ICD-10-CM

## 2022-07-15 DIAGNOSIS — Z3483 Encounter for supervision of other normal pregnancy, third trimester: Secondary | ICD-10-CM

## 2022-07-15 LAB — POCT URINALYSIS DIPSTICK OB
Bilirubin, UA: NEGATIVE
Blood, UA: NEGATIVE
Glucose, UA: NEGATIVE
Ketones, UA: NEGATIVE
Leukocytes, UA: NEGATIVE
Nitrite, UA: NEGATIVE
Spec Grav, UA: >=1.03 — AB (ref 1.010–1.025)
Urobilinogen, UA: 0.2 E.U./dL
pH, UA: 5 (ref 5.0–8.0)

## 2022-07-15 NOTE — Patient Instructions (Signed)

## 2022-07-15 NOTE — Progress Notes (Signed)
ROB doing well, feeling good movement. Denies questions /concerns. Discussed gbs testing next visit. She verbalizes understanding. Follow up 2 wk for ROB.   Doreene Burke, CNM

## 2022-07-17 DIAGNOSIS — Z419 Encounter for procedure for purposes other than remedying health state, unspecified: Secondary | ICD-10-CM | POA: Diagnosis not present

## 2022-07-29 ENCOUNTER — Other Ambulatory Visit (HOSPITAL_COMMUNITY)
Admission: RE | Admit: 2022-07-29 | Discharge: 2022-07-29 | Disposition: A | Discharge status: Home / Self Care | Payer: Medicaid Other | Source: Ambulatory Visit | Attending: Obstetrics | Admitting: Obstetrics

## 2022-07-29 ENCOUNTER — Encounter: Payer: Self-pay | Admitting: Obstetrics

## 2022-07-29 ENCOUNTER — Ambulatory Visit (INDEPENDENT_AMBULATORY_CARE_PROVIDER_SITE_OTHER): Payer: Medicaid Other | Admitting: Obstetrics

## 2022-07-29 VITALS — BP 109/73 | HR 118 | Wt 201.2 lb

## 2022-07-29 DIAGNOSIS — Z3483 Encounter for supervision of other normal pregnancy, third trimester: Secondary | ICD-10-CM

## 2022-07-29 DIAGNOSIS — Z113 Encounter for screening for infections with a predominantly sexual mode of transmission: Secondary | ICD-10-CM | POA: Insufficient documentation

## 2022-07-29 DIAGNOSIS — Z3685 Encounter for antenatal screening for Streptococcus B: Secondary | ICD-10-CM

## 2022-07-29 DIAGNOSIS — Z3A36 36 weeks gestation of pregnancy: Secondary | ICD-10-CM

## 2022-07-29 LAB — POCT URINALYSIS DIPSTICK OB
Bilirubin, UA: NEGATIVE
Blood, UA: NEGATIVE
Glucose, UA: NEGATIVE
Ketones, UA: NEGATIVE
Leukocytes, UA: NEGATIVE
Nitrite, UA: NEGATIVE
POC,PROTEIN,UA: NEGATIVE
Spec Grav, UA: 1.01 (ref 1.010–1.025)
Urobilinogen, UA: 0.2 E.U./dL
pH, UA: 7 (ref 5.0–8.0)

## 2022-07-29 NOTE — Progress Notes (Signed)
Routine Prenatal Care Visit  Subjective  Miranda Prince is a 22 y.o. [id number] at [redacted]w[redacted]d being seen today for ongoing prenatal care.  She is currently monitored for the following issues for this low-risk pregnancy and has Migraine without aura and without status migrainosus, not intractable; Vertiginous migraine; E-coli UTI; and Supervision of other normal pregnancy, antepartum on their problem list.  ----------------------------------------------------------------------------------- Patient reports no complaints.   Contractions: Irritability. Vag. Bleeding: None.  Movement: Present. Leaking Fluid denies.  ----------------------------------------------------------------------------------- The following portions of the patient's history were reviewed and updated as appropriate: allergies, current medications, past family history, past medical history, past social history, past surgical history and problem list. Problem list updated.  Objective  Blood pressure 109/73, pulse (!) 118, weight 201 lb 3.2 oz (91.3 kg), last menstrual period 11/19/2021, unknown if currently breastfeeding. Pregravid weight 160 lb (72.6 kg) Total Weight Gain 41 lb 3.2 oz (18.7 kg) Urinalysis: Urine Protein Negative  Urine Glucose Negative  Fetal Status: Fetal Heart Rate (bpm): 127 Fundal Height: 38 cm Movement: Present  Presentation: Vertex  General:  Alert, oriented and cooperative. Patient is in no acute distress.  Skin: Skin is warm and dry. No rash noted.   Cardiovascular: Normal heart rate noted  Respiratory: Normal respiratory effort, no problems with respiration noted  Abdomen: Soft, gravid, appropriate for gestational age. Pain/Pressure: Absent     Pelvic:  Cervical exam deferred        Extremities: Normal range of motion.  Edema: None  Mental Status: Normal mood and affect. Normal behavior. Normal judgment and thought content.   Assessment   22 y.o. [id number] at [redacted]w[redacted]d by  08/26/2022, by Last Menstrual Period  presenting for routine prenatal visit  Plan   fourth Problems (from 01/08/22 to present)     No problems associated with this episode.        Preterm labor symptoms and general obstetric precautions including but not limited to vaginal bleeding, contractions, leaking of fluid and fetal movement were reviewed in detail with the patient. Please refer to After Visit Summary for other counseling recommendations.  Cultures and GBS retrieved today.  Return in about 1 week (around 08/05/2022) for return OB.  Miranda Prince, CNM  07/29/2022 5:30 PM

## 2022-07-31 LAB — CERVICOVAGINAL ANCILLARY ONLY
Chlamydia: NEGATIVE
Comment: NEGATIVE
Comment: NORMAL
Neisseria Gonorrhea: NEGATIVE

## 2022-08-02 LAB — CULTURE, BETA STREP (GROUP B ONLY): Strep Gp B Culture: NEGATIVE

## 2022-08-05 ENCOUNTER — Ambulatory Visit (INDEPENDENT_AMBULATORY_CARE_PROVIDER_SITE_OTHER): Payer: Medicaid Other | Admitting: Obstetrics

## 2022-08-05 VITALS — BP 105/56 | HR 109 | Wt 200.0 lb

## 2022-08-05 DIAGNOSIS — Z3A37 37 weeks gestation of pregnancy: Secondary | ICD-10-CM

## 2022-08-05 DIAGNOSIS — Z3483 Encounter for supervision of other normal pregnancy, third trimester: Secondary | ICD-10-CM

## 2022-08-05 LAB — POCT URINALYSIS DIPSTICK OB
Bilirubin, UA: NEGATIVE
Blood, UA: NEGATIVE
Glucose, UA: NEGATIVE
Ketones, UA: NEGATIVE
Leukocytes, UA: NEGATIVE
Nitrite, UA: NEGATIVE
POC,PROTEIN,UA: NEGATIVE
Spec Grav, UA: 1.01 (ref 1.010–1.025)
Urobilinogen, UA: 1 E.U./dL
pH, UA: 5.5 (ref 5.0–8.0)

## 2022-08-05 NOTE — Progress Notes (Signed)
Routine Prenatal Care Visit  Subjective  Miranda Prince is a 22 y.o. [id number] at [redacted]w[redacted]d being seen today for ongoing prenatal care.  She is currently monitored for the following issues for this low-risk pregnancy and has Migraine without aura and without status migrainosus, not intractable; Vertiginous migraine; E-coli UTI; and Supervision of other normal pregnancy, antepartum on their problem list.  ----------------------------------------------------------------------------------- Patient reports no complaints.   Contractions: Not present. Vag. Bleeding: None.  Movement: Present. Leaking Fluid denies.  ----------------------------------------------------------------------------------- The following portions of the patient's history were reviewed and updated as appropriate: allergies, current medications, past family history, past medical history, past social history, past surgical history and problem list. Problem list updated.  Objective  Blood pressure (!) 105/56, pulse (!) 109, weight 200 lb (90.7 kg), last menstrual period 11/19/2021, unknown if currently breastfeeding. Pregravid weight 160 lb (72.6 kg) Total Weight Gain 40 lb (18.1 kg) Urinalysis: Urine Protein    Urine Glucose    Fetal Status:     Movement: Present     General:  Alert, oriented and cooperative. Patient is in no acute distress.  Skin: Skin is warm and dry. No rash noted.   Cardiovascular: Normal heart rate noted  Respiratory: Normal respiratory effort, no problems with respiration noted  Abdomen: Soft, gravid, appropriate for gestational age. Pain/Pressure: Absent     Pelvic:  Cervical exam deferred        Extremities: Normal range of motion.     Mental Status: Normal mood and affect. Normal behavior. Normal judgment and thought content.   Assessment   22 y.o. [id number] at [redacted]w[redacted]d by  08/26/2022, by Last Menstrual Period presenting for routine prenatal visit  Plan   fourth Problems (from 01/08/22 to present)      No problems associated with this episode.        Term labor symptoms and general obstetric precautions including but not limited to vaginal bleeding, contractions, leaking of fluid and fetal movement were reviewed in detail with the patient. Please refer to After Visit Summary for other counseling recommendations.   Return in about 1 week (around 08/12/2022) for return OB.  Miranda Prince, CNM  08/05/2022 4:03 PM

## 2022-08-13 ENCOUNTER — Encounter: Payer: Self-pay | Admitting: Neurology

## 2022-08-13 ENCOUNTER — Ambulatory Visit: Payer: Medicaid Other | Admitting: Neurology

## 2022-08-13 VITALS — BP 125/58 | HR 104 | Ht 61.0 in | Wt 204.2 lb

## 2022-08-13 DIAGNOSIS — G43009 Migraine without aura, not intractable, without status migrainosus: Secondary | ICD-10-CM

## 2022-08-13 MED ORDER — SERTRALINE HCL 50 MG PO TABS: 50.0000 mg | ORAL_TABLET | Freq: Every day | ORAL | 3 refills | Status: DC

## 2022-08-13 NOTE — Progress Notes (Signed)
NEUROLOGY FOLLOW UP OFFICE NOTE  Miranda Prince [id number] Aug 09, 2000  HISTORY OF PRESENT ILLNESS: I had the pleasure of seeing Miranda Prince in follow-up in the neurology clinic on 08/13/2022.  The patient was last seen a year ago for migraines and dizziness. She is alone in the office today. Records and images were personally reviewed where available.  She is pregnant, due to deliver baby Miranda Prince on 08/26/22. She had initially presented in 2018 with daily headaches and dizziness that had a good response to Sertraline. She was lost to follow-up and on last visit was restarted on Sertraline. She reports it again helped with migraines until a month ago when they worsened a little. She has migraines every other day or nightly, taking Tylenol which helps. There is no nausea/vomiting, vision changes, focal numbness/tingling/weakness. Dizziness is overall stable, she may have it first thing in the morning sometimes. No falls.    History on Initial Assessment 06/10/2016: This is an 22 yo RH woman who presented with worsening headaches and dizziness. She reports symptoms started intermittently at age 22, but have gotten worse the past 4 weeks, now occurring on a daily basis. She has throbbing headaches mostly over the right parietal region, but also over the frontal/vertex region, with associated nausea and dizziness. She describes the dizziness as either the room spinning or lightheadedness when she walks. Initially they were occurring intermittently with good response to Ibuprofen, however she states that over the past 4 weeks, symptoms have been near-constant. She would have headaches on a daily basis with an intensity of 4 or 5 over 10. The dizziness comes and goes. She has difficulty stating the duration, they mostly worsen when she is doing activities, which has mad it difficult for her to do anything at school. She does report that her grades are fine. She does not take any medication now for these.  There is no photo/phonophobia, vomiting, visual obscurations, tinnitus, focal numbness/tingling/weakness in her extremities, but she does have tingling on the right parietal region when it throbs. She has neck and back pain, no diplopia, dysarthria, bowel/bladder dysfunction. She has noticed some trouble swallowing the past 2 weeks. Her mother used to have migraines. She has not had any brain imaging in the past. She reports mood is "neutral." She reports good sleep except when she has headaches, appetite is good.   PAST MEDICAL HISTORY: Past Medical History:  Diagnosis Date   Headache    History of premature rupture of membranes 04/08/2017   Labor and delivery, indication for care 07/12/2019    MEDICATIONS: Current Outpatient Medications on File Prior to Visit  Medication Sig Dispense Refill   acetaminophen (TYLENOL) 325 MG tablet Take 2 tablets (650 mg total) by mouth every 4 (four) hours as needed (for pain scale < 4). 30 tablet 0   Prenatal MV & Min w/FA-DHA (PRENATAL ADULT GUMMY/DHA/FA PO) Take by mouth.     sertraline (ZOLOFT) 50 MG tablet Take 1 tablet (50 mg total) by mouth daily. 90 tablet 3   No current facility-administered medications on file prior to visit.    ALLERGIES: No Known Allergies  FAMILY HISTORY: Family History  Problem Relation Age of Onset   Diabetes Mother    Seizures Maternal Grandmother    Stroke Paternal Grandfather    Diabetes Maternal Uncle    Depression Paternal Aunt    Asthma Paternal Aunt    COPD Paternal Aunt    Anxiety disorder Paternal Aunt    Diabetes Other  Cancer Other     SOCIAL HISTORY: Social History   Socioeconomic History   Marital status: Married    Spouse name: [place]   Number of children: 2   Years of education: 12   Highest education level: Not on file  Occupational History   Occupation: cleans houses c a IT consultant  Tobacco Use   Smoking status: Never   Smokeless tobacco: Never  Vaping Use   Vaping Use:  Former  Substance and Sexual Activity   Alcohol use: No   Drug use: No   Sexual activity: Yes    Partners: Male    Birth control/protection: None  Other Topics Concern   Not on file  Social History Narrative   Right handed    Lives with family    Just had a baby   Social Determinants of Health   Financial Resource Strain: Medium Risk (01/08/2022)   Overall Financial Resource Strain (CARDIA)    Difficulty of Paying Living Expenses: Somewhat hard  Food Insecurity: No Food Insecurity (01/08/2022)   Hunger Vital Sign    Worried About Running Out of Food in the Last Year: Never true    Ran Out of Food in the Last Year: Never true  Transportation Needs: No Transportation Needs (01/08/2022)   PRAPARE - Administrator, Civil Service (Medical): No    Lack of Transportation (Non-Medical): No  Physical Activity: Insufficiently Active (01/08/2022)   Exercise Vital Sign    Days of Exercise per Week: 1 day    Minutes of Exercise per Session: 20 min  Stress: No Stress Concern Present (01/08/2022)   [employer] of Occupational Health - Occupational Stress Questionnaire    Feeling of Stress : Not at all  Social Connections: Moderately Integrated (01/08/2022)   Social Connection and Isolation Panel [NHANES]    Frequency of Communication with Friends and Family: More than three times a week    Frequency of Social Gatherings with Friends and Family: Twice a week    Attends Religious Services: More than 4 times per year    Active Member of [employer] or Organizations: No    Attends Banker Meetings: Never    Marital Status: Married  Catering manager Violence: Not At Risk (01/08/2022)   Humiliation, Afraid, Rape, and Kick questionnaire    Fear of Current or Ex-Partner: No    Emotionally Abused: No    Physically Abused: No    Sexually Abused: No     PHYSICAL EXAM: Vitals:   08/13/22 1414  BP: (!) 125/58  Pulse: (!) 104  SpO2: 98%   General: No acute  distress Head:  Normocephalic/atraumatic Skin/Extremities: No rash, no edema Neurological Exam: alert and awake. No aphasia or dysarthria. Fund of knowledge is appropriate.  Attention and concentration are normal.   Cranial nerves: Pupils equal, round. Extraocular movements intact with no nystagmus. Visual fields full.  No facial asymmetry.  Motor: Bulk and tone normal, muscle strength 5/5 throughout with no pronator drift.   Finger to nose testing intact.  Gait slightly wide-based, no ataxia.   IMPRESSION: This is a 22 yo RH woman with a history of headaches and dizziness since childhood suggestive of migraines without aura, who initially presented in 2018 with a change in headaches and dizziness that were occurring on a daily basis. MRI brain normal in 2018. She had an excellent response to Sertraline 50mg  daily. She is due to deliver a baby body in 2 weeks and reports an increase in migraines  this past month, no red flags. Continue to monitor headaches after delivery, we may consider increasing Sertraline if needed. Follow-up in 4-5 months, she knows to call for any changes.    Thank you for allowing me to participate in her care.  Please do not hesitate to call for any questions or concerns.    Patrcia Dolly, M.D.   CC: Doreene Burke, CNM

## 2022-08-13 NOTE — Patient Instructions (Signed)
Good to see you. Continue Sertraline 50mg  daily. Wishing you all the best, congratulations! Follow-up in 4-5 months, call for any changes.

## 2022-08-16 ENCOUNTER — Ambulatory Visit (INDEPENDENT_AMBULATORY_CARE_PROVIDER_SITE_OTHER): Payer: Medicaid Other | Admitting: Certified Nurse Midwife

## 2022-08-16 VITALS — BP 110/70 | HR 118 | Wt 201.0 lb

## 2022-08-16 DIAGNOSIS — Z3A38 38 weeks gestation of pregnancy: Secondary | ICD-10-CM

## 2022-08-16 DIAGNOSIS — Z3483 Encounter for supervision of other normal pregnancy, third trimester: Secondary | ICD-10-CM

## 2022-08-16 IMAGING — CT CT NECK W/ CM
5 of 6 series · 14 of 33 positions shown, 16 images · IV contrast (agent unspecified)
Comparison: None.

CLINICAL DATA: Right neck mass

EXAM:
CT NECK WITH CONTRAST
TECHNIQUE: Multidetector CT imaging of the neck was performed using the
standard protocol following the bolus administration of intravenous
contrast.

[Series 3: axial neck · axial · 0.37mm/px · z∈[-270,-192]mm · 2 of 118 slices shown, 3 images]
[im 40/118  soft-tissue]
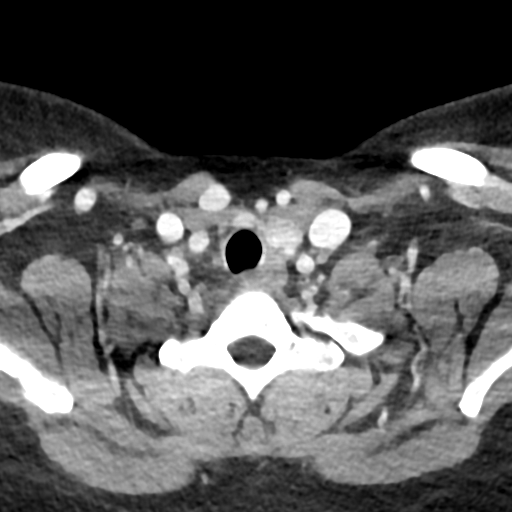
[im 40/118  bone]
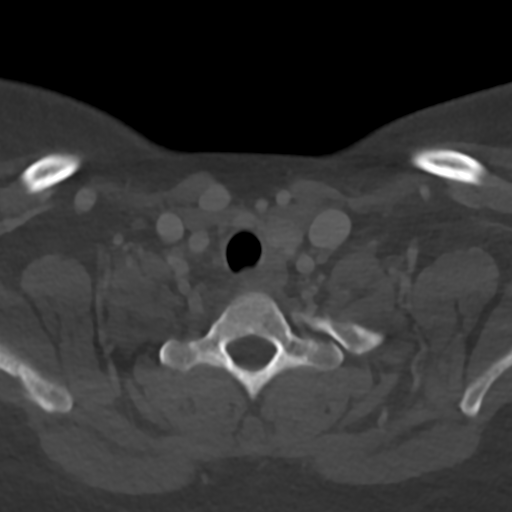
[im 79/118  bone]
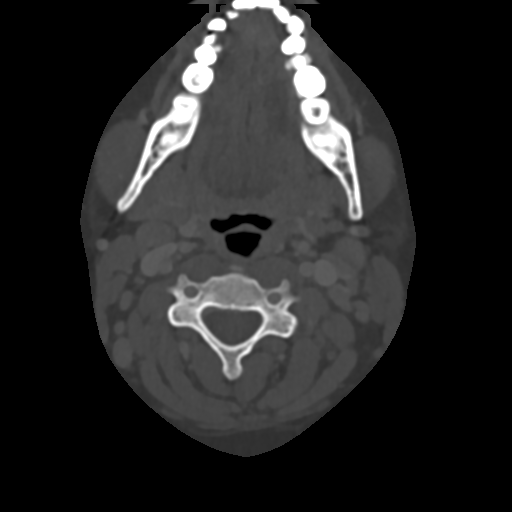

[Series 4: axial bone · axial · 0.37mm/px · z∈[-270,-192]mm · 2 of 118 slices shown]
[im 40/118  bone]
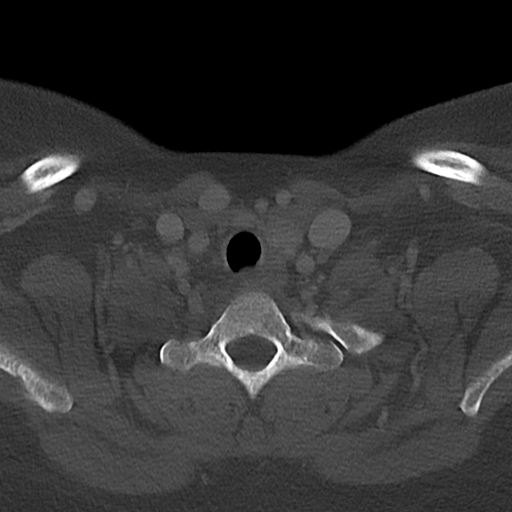
[im 79/118  bone]
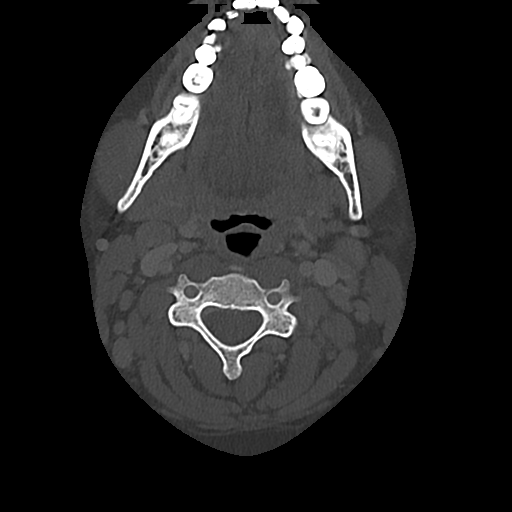

[Series 6: sag neck · sagittal · 0.47mm/px · 5 of 141 slices shown, 6 images]
[im 47/141  bone]
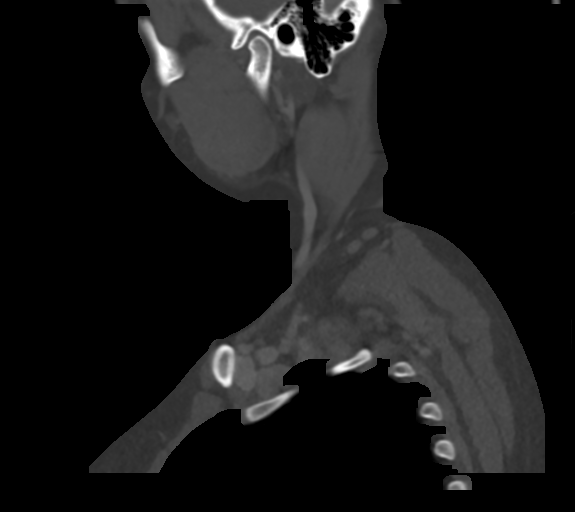
[im 59/141  bone]
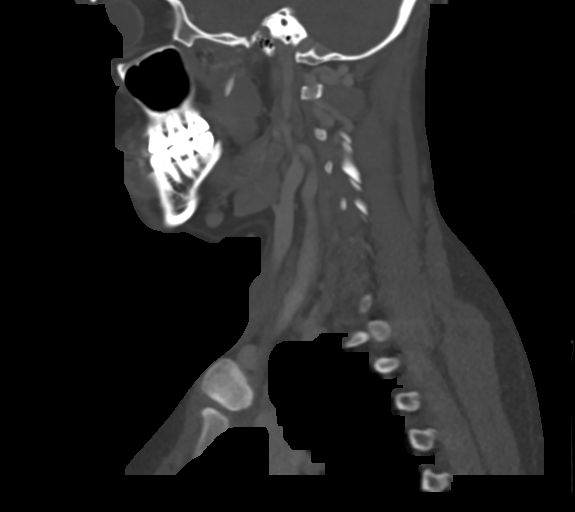
[im 71/141  soft-tissue]
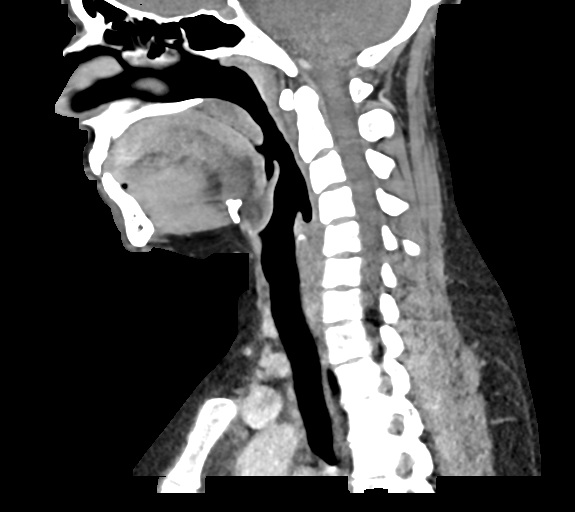
[im 71/141  bone]
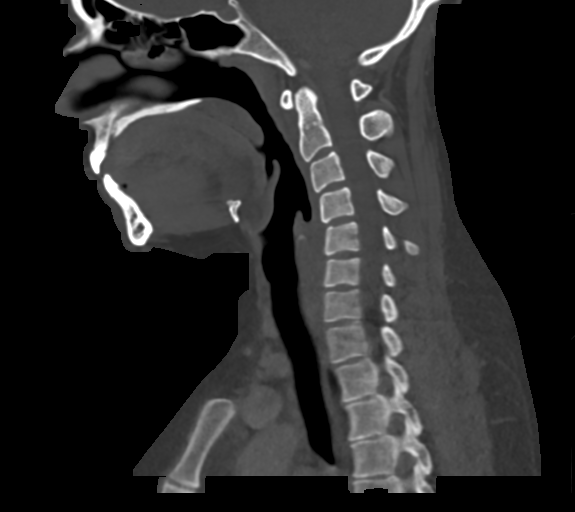
[im 82/141  bone]
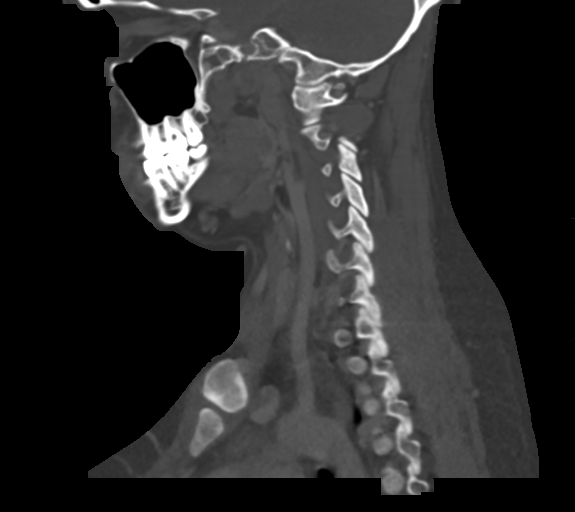
[im 94/141  bone]
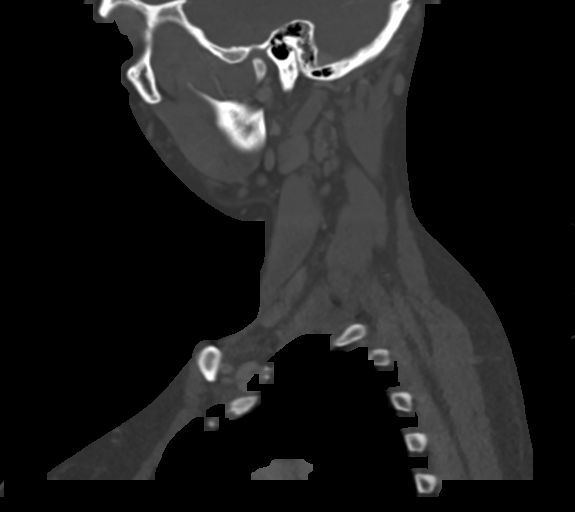

[Series 7: cor neck · coronal · 0.43mm/px · 3 of 99 slices shown]
[im 20/99  bone]
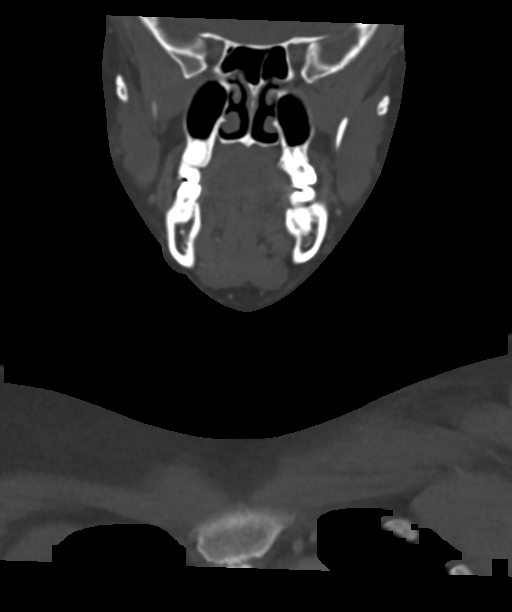
[im 40/99  bone]
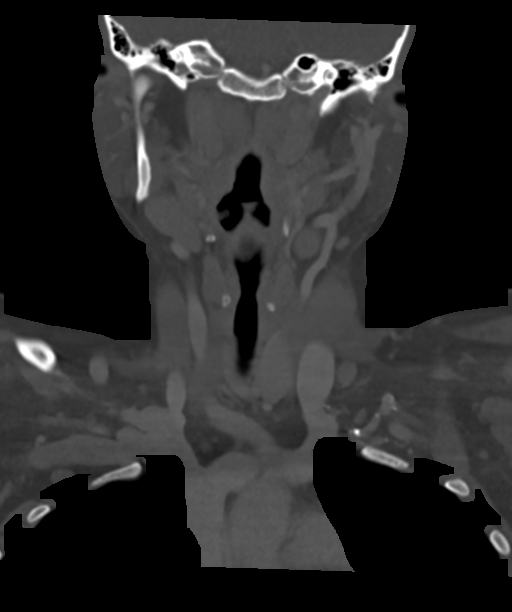
[im 59/99  bone]
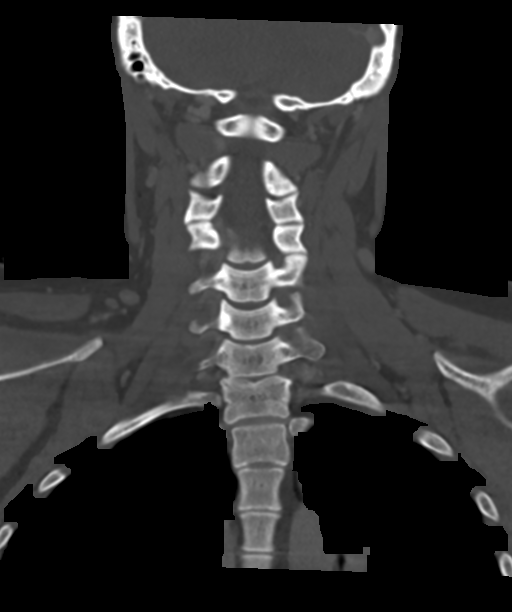

[Series 8: ax oropharynx · axial · 0.40mm/px · z∈[-293,-214]mm · 2 of 122 slices shown]
[im 41/122  bone]
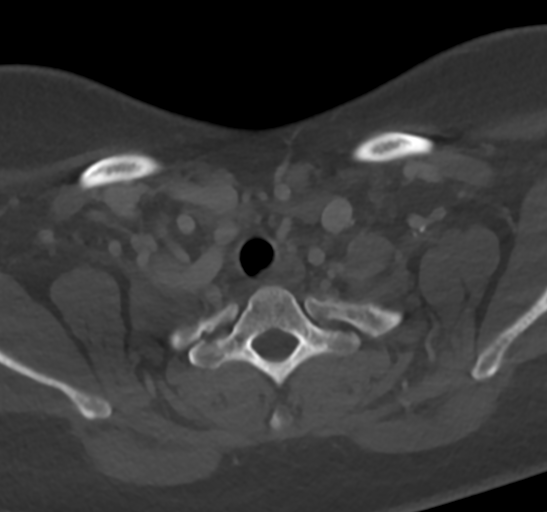
[im 81/122  bone]
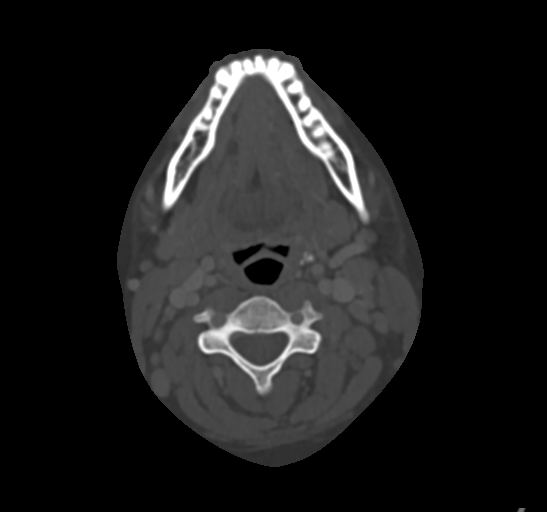

[14 of 33 positions shown; findings below may reference images not displayed]

RADIATION DOSE REDUCTION: This exam was performed according to the
departmental dose-optimization program which includes automated
exposure control, adjustment of the mA and/or kV according to
patient size and/or use of iterative reconstruction technique.

CONTRAST:  75mL OMNIPAQUE IOHEXOL 300 MG/ML  SOLN
FINDINGS: Pharynx and larynx: Tonsils normal. Airway intact. Epiglottis
normal.

Asymmetry of the left piriform sinus which is incompletely aerated.
There may be associated mucosal thickening in this area.

Salivary glands: No inflammation, mass, or stone.

Thyroid: Negative

Lymph nodes: Area of palpable concern in the right posterolateral
neck was marked with a BB. This corresponds to a 7 mm subcutaneous
lymph node with homogeneous enhancement. There is scattered small
adjacent posterior lymph nodes on the right. Similar but smaller
nodes are present on the left. Right level 2 lymph node 10 mm. Left
level 2 lymph nodes 13 mm and 12 mm in diameter.

Vascular: Normal vascular enhancement.

Limited intracranial: Negative

Visualized orbits: Negative

Mastoids and visualized paranasal sinuses: Paranasal sinuses clear.
Mastoid and middle ear clear

Skeleton: Negative

Upper chest: Lung apices clear bilaterally.

Other: None
IMPRESSION: Palpable area of concern in the right posterolateral neck
corresponds to a 7 mm lymph node. Multiple lymph nodes are seen in
the neck bilaterally but there is no pathologic or enlarged lymph
node identified.

Asymmetry left piriform sinus with apparent mucosal thickening.
Mucosal evaluation and direct visualization suggested for further
evaluation.

## 2022-08-16 NOTE — Patient Instructions (Signed)

## 2022-08-16 NOTE — Progress Notes (Signed)
   PRENATAL VISIT NOTE  Subjective:  Miranda Prince is a 22 y.o. [id number] at [redacted]w[redacted]d being seen today for ongoing prenatal care.  She is currently monitored for the following issues for this low-risk pregnancy and has Migraine without aura and without status migrainosus, not intractable; Vertiginous migraine; E-coli UTI; and Supervision of other normal pregnancy, antepartum on their problem list.  Patient reports no complaints.  Contractions: Irritability. Vag. Bleeding: None.  Movement: Present. Denies leaking of fluid. Declines SVE.   The following portions of the patient's history were reviewed and updated as appropriate: allergies, current medications, past family history, past medical history, past social history, past surgical history and problem list.   Objective:   Vitals:   08/16/22 1317  BP: 110/70  Pulse: (!) 118  Weight: 201 lb (91.2 kg)   Total weight gain: 41 lb (18.6 kg) Fetal Status: Fetal Heart Rate (bpm): 120 Fundal Height: 38 cm Movement: Present  Presentation: Vertex   General:  Alert, oriented and cooperative. Patient is in no acute distress.  Skin: Skin is warm and dry. No rash noted.   Cardiovascular: Normal heart rate noted  Respiratory: Normal respiratory effort, no problems with respiration noted  Abdomen: Soft, gravid, appropriate for gestational age.  Pain/Pressure: Present     Pelvic: Cervical exam deferred        Extremities: Normal range of motion.  Edema: None  Mental Status: Normal mood and affect. Normal behavior. Normal judgment and thought content.   Assessment and Plan:  Pregnancy: [id number] at [redacted]w[redacted]d 1. Encounter for supervision of other normal pregnancy in third trimester  Term labor symptoms and general obstetric precautions including but not limited to vaginal bleeding, contractions, leaking of fluid and fetal movement were reviewed in detail with the patient. Please refer to After Visit Summary for other counseling recommendations.  Reviewed  postdates management, open to 41w IOL if needed.  Return in 1 week (on 08/23/2022) for ROB.  Future Appointments  Date Time Provider Department Center  08/20/2022  3:15 PM Doreene Burke, CNM [facility] None  12/10/2022  4:00 PM Van Clines, MD [facility] None    Dominica Severin, CNM

## 2022-08-17 DIAGNOSIS — Z419 Encounter for procedure for purposes other than remedying health state, unspecified: Secondary | ICD-10-CM | POA: Diagnosis not present

## 2022-08-20 ENCOUNTER — Encounter: Payer: Self-pay | Admitting: Certified Nurse Midwife

## 2022-08-20 ENCOUNTER — Ambulatory Visit (INDEPENDENT_AMBULATORY_CARE_PROVIDER_SITE_OTHER): Payer: Medicaid Other | Admitting: Certified Nurse Midwife

## 2022-08-20 VITALS — BP 100/54 | HR 109 | Wt 203.7 lb

## 2022-08-20 DIAGNOSIS — Z3A39 39 weeks gestation of pregnancy: Secondary | ICD-10-CM

## 2022-08-20 DIAGNOSIS — Z3483 Encounter for supervision of other normal pregnancy, third trimester: Secondary | ICD-10-CM

## 2022-08-20 LAB — POCT URINALYSIS DIPSTICK OB
Bilirubin, UA: NEGATIVE
Blood, UA: NEGATIVE
Glucose, UA: NEGATIVE
Ketones, UA: NEGATIVE
Leukocytes, UA: NEGATIVE
Nitrite, UA: NEGATIVE
POC,PROTEIN,UA: NEGATIVE
Spec Grav, UA: 1.025 (ref 1.010–1.025)
Urobilinogen, UA: 0.2 E.U./dL
pH, UA: 6 (ref 5.0–8.0)

## 2022-08-20 NOTE — Patient Instructions (Signed)

## 2022-08-20 NOTE — Progress Notes (Signed)
ROB doing well, feeling regular movement. Discussed NST next visit . She agrees. She declines SVE today. Labor precautions reviewed. Follow up 1 wk .   Doreene Burke, CNM

## 2022-08-27 DIAGNOSIS — Z3A40 40 weeks gestation of pregnancy: Secondary | ICD-10-CM | POA: Insufficient documentation

## 2022-08-27 DIAGNOSIS — O480 Post-term pregnancy: Secondary | ICD-10-CM | POA: Insufficient documentation

## 2022-08-27 HISTORY — DX: Post-term pregnancy: O48.0

## 2022-08-27 NOTE — Progress Notes (Unsigned)
    NURSE VISIT NOTE  Subjective:    Patient ID: Marjarie Irion, female    DOB: Dec 28, 2000, 22 y.o.   MRN: [id number]  HPI  Patient is a 22 y.o. [phone] female who presents for fetal monitoring per order from Doreene Burke, [place].   Objective:    LMP 11/19/2021 (Exact Date)  Estimated Date of Delivery: 08/26/22  [redacted]w[redacted]d  Fetus A Non-Stress Test Interpretation for 08/27/22  Indication: Post Dates            Assessment:   1. Post-term pregnancy, 40-42 weeks of gestation   2. [redacted] weeks gestation of pregnancy      Plan:   Results reviewed and discussed with patient by  Doreene Burke, CNM.     Rocco Serene, LPN

## 2022-08-27 NOTE — Patient Instructions (Signed)

## 2022-08-28 ENCOUNTER — Ambulatory Visit (INDEPENDENT_AMBULATORY_CARE_PROVIDER_SITE_OTHER): Payer: Medicaid Other | Admitting: Certified Nurse Midwife

## 2022-08-28 ENCOUNTER — Ambulatory Visit (INDEPENDENT_AMBULATORY_CARE_PROVIDER_SITE_OTHER): Payer: Medicaid Other

## 2022-08-28 VITALS — BP 93/68 | HR 123 | Ht 61.0 in | Wt 205.0 lb

## 2022-08-28 DIAGNOSIS — Z3A40 40 weeks gestation of pregnancy: Secondary | ICD-10-CM

## 2022-08-28 DIAGNOSIS — O480 Post-term pregnancy: Secondary | ICD-10-CM | POA: Diagnosis not present

## 2022-08-28 NOTE — Progress Notes (Signed)
ROB and NST for post dates. NST reactive see nurse documentation. Discussed induction with pt. She is agreeable. Will schedule today.  Labor precautions reviewed. SVE  3/thick/-3   Doreene Burke, CNM

## 2022-08-28 NOTE — Patient Instructions (Signed)
.  Braxton Hicks Contractions  Contractions of the uterus can occur throughout pregnancy, but they are not always a sign that you are in labor. You may have practice contractions called Braxton Hicks contractions. These false labor contractions are sometimes confused with true labor. What are Braxton Hicks contractions? Braxton Hicks contractions are tightening movements that occur in the muscles of the uterus before labor. Unlike true labor contractions, these contractions do not result in opening (dilation) and thinning of the lowest part of the uterus (cervix). Toward the end of pregnancy (32-34 weeks), Braxton Hicks contractions can happen more often and may become stronger. These contractions are sometimes difficult to tell apart from true labor because they can be very uncomfortable. How to tell the difference between true labor and false labor True labor Contractions last 30-70 seconds. Contractions become very regular. Discomfort is usually felt in the top of the uterus, and it spreads to the lower abdomen and low back. Contractions do not go away with walking. Contractions usually become stronger and more frequent. The cervix dilates and gets thinner. False labor Contractions are usually shorter, weaker, and farther apart than true labor contractions. Contractions are usually irregular. Contractions are often felt in the front of the lower abdomen and in the groin. Contractions may go away when you walk around or change positions while lying down. The cervix usually does not dilate or become thin. Sometimes, the only way to tell if you are in true labor is for your health care provider to look for changes in your cervix. Your health care provider will do a physical exam and may monitor your contractions. If you are in true labor, your health care provider will send you home with instructions about when to return to the hospital. You may continue to have Braxton Hicks contractions until  you go into true labor. Follow these instructions at home:  Take over-the-counter and prescription medicines only as told by your health care provider. If Braxton Hicks contractions are making you uncomfortable: Change your position from lying down or resting to walking, or change from walking to resting. Sit and rest in a tub of warm water. Drink enough fluid to keep your urine pale yellow. Dehydration may cause these contractions. Do slow and deep breathing several times an hour. Keep all follow-up visits. This is important. Contact a health care provider if: You have a fever. You have continuous pain in your abdomen. Your contractions become stronger, more regular, and closer together. You pass blood-tinged mucus. Get help right away if: You have fluid leaking or gushing from your vagina. You have bright red blood coming from your vagina. Your baby is not moving inside you as much as it used to. Summary You may have practice contractions called Braxton Hicks contractions. These false labor contractions are sometimes confused with true labor. Braxton Hicks contractions are usually shorter, weaker, farther apart, and less regular than true labor contractions. True labor contractions usually become stronger, more regular, and more frequent. Manage discomfort from Braxton Hicks contractions by changing position, resting in a warm bath, practicing deep breathing, and drinking plenty of water. Keep all follow-up visits. Contact your health care provider if your contractions become stronger, more regular, and closer together. This information is not intended to replace advice given to you by your health care provider. Make sure you discuss any questions you have with your health care provider. Document Revised: 01/10/2020 Document Reviewed: 01/10/2020 Elsevier Patient Education  2024 [employer]  

## 2022-08-29 ENCOUNTER — Encounter: Payer: Self-pay | Admitting: Certified Nurse Midwife

## 2022-09-03 ENCOUNTER — Other Ambulatory Visit: Payer: Self-pay

## 2022-09-03 ENCOUNTER — Inpatient Hospital Stay: Payer: Medicaid Other | Admitting: Anesthesiology

## 2022-09-03 ENCOUNTER — Inpatient Hospital Stay
Admission: EM | Admit: 2022-09-03 | Discharge: 2022-09-05 | DRG: 806 | Disposition: A | Discharge status: Home / Self Care | Payer: Medicaid Other | Attending: Certified Nurse Midwife | Admitting: Certified Nurse Midwife

## 2022-09-03 ENCOUNTER — Encounter: Payer: Self-pay | Admitting: Certified Nurse Midwife

## 2022-09-03 DIAGNOSIS — D62 Acute posthemorrhagic anemia: Secondary | ICD-10-CM | POA: Diagnosis not present

## 2022-09-03 DIAGNOSIS — Z3A41 41 weeks gestation of pregnancy: Secondary | ICD-10-CM

## 2022-09-03 DIAGNOSIS — O2653 Maternal hypotension syndrome, third trimester: Secondary | ICD-10-CM | POA: Diagnosis not present

## 2022-09-03 DIAGNOSIS — O480 Post-term pregnancy: Principal | ICD-10-CM | POA: Diagnosis present

## 2022-09-03 DIAGNOSIS — O9081 Anemia of the puerperium: Secondary | ICD-10-CM | POA: Diagnosis not present

## 2022-09-03 LAB — RPR: RPR Ser Ql: NONREACTIVE

## 2022-09-03 LAB — CBC
HCT: 32.4 % — ABNORMAL LOW (ref 36.0–46.0)
Hemoglobin: 10.2 g/dL — ABNORMAL LOW (ref 12.0–15.0)
MCH: 24.2 pg — ABNORMAL LOW (ref 26.0–34.0)
MCHC: 31.5 g/dL (ref 30.0–36.0)
MCV: 76.8 fL — ABNORMAL LOW (ref 80.0–100.0)
Platelets: 269 10*3/uL (ref 150–400)
RBC: 4.22 MIL/uL (ref 3.87–5.11)
RDW: 15.2 % (ref 11.5–15.5)
WBC: 10.2 10*3/uL (ref 4.0–10.5)
nRBC: 0 % (ref 0.0–0.2)

## 2022-09-03 LAB — TYPE AND SCREEN
ABO/RH(D): O POS
Antibody Screen: NEGATIVE

## 2022-09-03 MED ORDER — OXYTOCIN BOLUS FROM INFUSION
333.0000 mL | Freq: Once | INTRAVENOUS | Status: AC
  Administered 2022-09-04: 333 mL via INTRAVENOUS

## 2022-09-03 MED ORDER — LACTATED RINGERS IV SOLN
500.0000 mL | INTRAVENOUS | Status: DC | PRN
  Administered 2022-09-03: 500 mL via INTRAVENOUS
  Administered 2022-09-03: 1000 mL via INTRAVENOUS

## 2022-09-03 MED ORDER — LACTATED RINGERS IV SOLN: 500.0000 mL | Freq: Once | INTRAVENOUS | Status: DC

## 2022-09-03 MED ORDER — EPHEDRINE 5 MG/ML INJ: 10.0000 mg | INTRAVENOUS | Status: DC | PRN

## 2022-09-03 MED ORDER — BUPIVACAINE HCL (PF) 0.25 % IJ SOLN
INTRAMUSCULAR | Status: DC | PRN
  Administered 2022-09-03: 3 mL via EPIDURAL

## 2022-09-03 MED ORDER — SOD CITRATE-CITRIC ACID 500-334 MG/5ML PO SOLN: 30.0000 mL | ORAL | Status: DC | PRN

## 2022-09-03 MED ORDER — PHENYLEPHRINE 80 MCG/ML (10ML) SYRINGE FOR IV PUSH (FOR BLOOD PRESSURE SUPPORT): 80.0000 ug | PREFILLED_SYRINGE | INTRAVENOUS | Status: DC | PRN

## 2022-09-03 MED ORDER — OXYTOCIN-SODIUM CHLORIDE 30-0.9 UT/500ML-% IV SOLN
1.0000 m[IU]/min | INTRAVENOUS | Status: DC
  Administered 2022-09-03: 2 m[IU]/min via INTRAVENOUS
  Filled 2022-09-03: qty 500

## 2022-09-03 MED ORDER — LIDOCAINE-EPINEPHRINE (PF) 1.5 %-1:200000 IJ SOLN
INTRAMUSCULAR | Status: DC | PRN
  Administered 2022-09-03: 3 mL via EPIDURAL

## 2022-09-03 MED ORDER — OXYTOCIN 10 UNIT/ML IJ SOLN
INTRAMUSCULAR | Status: AC
  Filled 2022-09-03: qty 2

## 2022-09-03 MED ORDER — OXYTOCIN-SODIUM CHLORIDE 30-0.9 UT/500ML-% IV SOLN
2.5000 [IU]/h | INTRAVENOUS | Status: DC
  Filled 2022-09-03: qty 500

## 2022-09-03 MED ORDER — AMMONIA AROMATIC IN INHA
RESPIRATORY_TRACT | Status: AC
  Filled 2022-09-03: qty 10

## 2022-09-03 MED ORDER — PHENYLEPHRINE 80 MCG/ML (10ML) SYRINGE FOR IV PUSH (FOR BLOOD PRESSURE SUPPORT)
80.0000 ug | PREFILLED_SYRINGE | INTRAVENOUS | Status: DC | PRN
  Administered 2022-09-03 (×2): 80 ug via INTRAVENOUS

## 2022-09-03 MED ORDER — MISOPROSTOL 200 MCG PO TABS
ORAL_TABLET | ORAL | Status: AC
  Administered 2022-09-04: 800 ug via RECTAL
  Filled 2022-09-03: qty 4

## 2022-09-03 MED ORDER — LIDOCAINE HCL (PF) 1 % IJ SOLN
INTRAMUSCULAR | Status: DC | PRN
  Administered 2022-09-03: 3 mL

## 2022-09-03 MED ORDER — PHENYLEPHRINE HCL (PRESSORS) 10 MG/ML IV SOLN
INTRAVENOUS | Status: DC | PRN
  Administered 2022-09-03: 80 ug via INTRAVENOUS
  Administered 2022-09-03: 160 ug via INTRAVENOUS
  Administered 2022-09-03: 80 ug via INTRAVENOUS

## 2022-09-03 MED ORDER — LIDOCAINE HCL (PF) 1 % IJ SOLN
INTRAMUSCULAR | Status: AC
  Filled 2022-09-03: qty 30

## 2022-09-03 MED ORDER — LACTATED RINGERS IV SOLN: INTRAVENOUS | Status: DC

## 2022-09-03 MED ORDER — ACETAMINOPHEN 325 MG PO TABS
650.0000 mg | ORAL_TABLET | ORAL | Status: DC | PRN
  Administered 2022-09-03: 650 mg via ORAL
  Filled 2022-09-03: qty 2

## 2022-09-03 MED ORDER — TERBUTALINE SULFATE 1 MG/ML IJ SOLN: 0.2500 mg | Freq: Once | INTRAMUSCULAR | Status: DC | PRN

## 2022-09-03 MED ORDER — DIPHENHYDRAMINE HCL 50 MG/ML IJ SOLN: 12.5000 mg | INTRAMUSCULAR | Status: DC | PRN

## 2022-09-03 MED ORDER — LIDOCAINE HCL (PF) 1 % IJ SOLN: 30.0000 mL | INTRAMUSCULAR | Status: DC | PRN

## 2022-09-03 MED ORDER — FENTANYL-BUPIVACAINE-NACL 0.5-0.125-0.9 MG/250ML-% EP SOLN
12.0000 mL/h | EPIDURAL | Status: DC | PRN
  Administered 2022-09-03: 8 mL/h via EPIDURAL
  Filled 2022-09-03: qty 250

## 2022-09-03 MED ORDER — FENTANYL CITRATE (PF) 100 MCG/2ML IJ SOLN: 50.0000 ug | INTRAMUSCULAR | Status: DC | PRN

## 2022-09-03 MED ORDER — ONDANSETRON HCL 4 MG/2ML IJ SOLN
4.0000 mg | Freq: Four times a day (QID) | INTRAMUSCULAR | Status: DC | PRN
  Administered 2022-09-04: 4 mg via INTRAVENOUS
  Filled 2022-09-03: qty 2

## 2022-09-03 NOTE — Progress Notes (Signed)
LABOR NOTE   SUBJECTIVE:   Miranda Prince is a 22 y.o.  [id number]  at [redacted]w[redacted]d, induced labor due to postdates. Comfortable with epidural    Analgesia: Epidural  OBJECTIVE:  BP 114/72   Pulse 83   Temp 97.9 F (36.6 C) (Axillary)   Resp 16   Ht 5\' 1"  (1.549 m)   Wt 93 kg   LMP 11/19/2021 (Exact Date)   SpO2 98%   BMI 38.73 kg/m  Total I/O In: -  Out: 950 [Urine:950]  SVE:   Dilation: 9 Effacement (%): 100 Station: -1 Exam by:: Hartley Barefoot, CNM CONTRACTIONS: regular, every 2-3 minutes FHR: Fetal heart tracing reviewed. Baseline: 135 Variability: moderate Accelerations: present Decelerations:early Category I  Labs: Lab Results  Component Value Date   WBC 10.2 09/03/2022   HGB 10.2 (L) 09/03/2022   HCT 32.4 (L) 09/03/2022   MCV 76.8 (L) 09/03/2022   PLT 269 09/03/2022    ASSESSMENT: 1) Induction of labor due to postterm,  progressing well on pitocin     Coping: comfortable after restart of epidural     Membranes: ruptured spontaneous at 1858, clear fluid     GBS negative, no antibiotics indicated        Active Problems:   Postmaturity pregnancy, 40-[redacted] weeks gestation   Labor and delivery, indication for care   PLAN: continue present management, recheck SVE in 2h or with increasing vaginal pressure. Anticipate NSVD  Dominica Severin, CNM 09/03/2022 10:07 PM

## 2022-09-03 NOTE — H&P (Signed)
History and Physical   HPI  Miranda Prince is a 22 y.o. [id number] at [redacted]w[redacted]d Estimated Date of Delivery: 08/26/22 who is being admitted for induction of labor due to postdates pregnancy. Endorses fetal movement, denies loss of fluid, vaginal bleeding or painful contractions. Plans epidural for pain management.   OB History  OB History  Gravida Para Term Preterm AB Living  4 2 2  0 1 2  SAB IAB Ectopic Multiple Live Births  1 0 0 0 2    # Outcome Date GA Lbr Len/2nd Weight Sex Delivery Anes PTL Lv  4 Current           3 Term 07/12/19 [redacted]w[redacted]d / 00:30 3020 g F Vag-Spont EPI  LIV     Name: Miranda Prince     Apgar1: 7  Apgar5: 9  2 SAB 07/2017     SAB     1 Term 04/08/17 [redacted]w[redacted]d / 00:26 3255 g F Vag-Spont EPI  LIV     Name: Miranda Prince     Apgar1: 7  Apgar5: 9    PROBLEM LIST  Pregnancy complications or risks: Patient Active Problem List   Diagnosis Date Noted   Labor and delivery, indication for care 09/03/2022   Postmaturity pregnancy, 40-[redacted] weeks gestation 08/27/2022   [redacted] weeks gestation of pregnancy 08/27/2022   Supervision of other normal pregnancy, antepartum 01/08/2022   E-coli UTI 12/15/2018   Migraine without aura and without status migrainosus, not intractable 06/10/2016   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 No pertinent surgical history.   Medications    Current Discharge Medication List     CONTINUE these medications which have NOT CHANGED   Details  acetaminophen (TYLENOL) 325 MG tablet Take 2 tablets (650 mg total) by  mouth every 4 (four) hours as needed (for pain scale < 4). Qty: 30 tablet, Refills: 0    calcium carbonate (TUMS EX) 750 MG chewable tablet Chew 1 tablet by mouth daily.    Prenatal MV & Min w/FA-DHA (PRENATAL ADULT GUMMY/DHA/FA PO) Take by mouth.    sertraline (ZOLOFT) 50 MG tablet Take 1 tablet (50 mg total) by mouth daily. Qty: 90 tablet, Refills: 3         Allergies  Patient has no known allergies.  Review of Systems  Pertinent items are noted in HPI.  Physical Exam  BP (!) 118/50 (BP Location: Right Arm)   Pulse (!) 106   Temp 98.1 F (36.7 C) (Oral)   Resp 18   Ht 5\' 1"  (1.549 m)   Wt 93 kg   LMP 11/19/2021 (Exact Date)   BMI 38.73 kg/m   General: A&Ox4, NAD Lungs:  normal work of breathing, CTAB Cardio: RRR without M/R/G Abd: Soft, gravid, NT Presentation: cephalic CERVIX: Dilation: 3 Effacement (%): 50 Station: -3 Presentation: Vertex Exam by:: Benjamine Mola  See Prenatal records for more detailed PE.     FHR:  Baseline: 130 Variability: moderate Accelerations: absent Decelerations: variable Toco: irregular, occasional Category II  Test Results  Results for orders placed or performed during the hospital encounter  of 09/03/22 (from the past 24 hour(s))  CBC     Status: Abnormal   Collection Time: 09/03/22  8:35 AM  Result Value Ref Range   WBC 10.2 4.0 - 10.5 K/uL   RBC 4.22 3.87 - 5.11 MIL/uL   Hemoglobin 10.2 (L) 12.0 - 15.0 g/dL   HCT 16.1 (L) 09.6 - 04.5 %   MCV 76.8 (L) 80.0 - 100.0 fL   MCH 24.2 (L) 26.0 - 34.0 pg   MCHC 31.5 30.0 - 36.0 g/dL   RDW 40.9 81.1 - 91.4 %   Platelets 269 150 - 400 K/uL   nRBC 0.0 0.0 - 0.2 %  Type and screen     Status: None (Preliminary result)   Collection Time: 09/03/22  8:35 AM  Result Value Ref Range   ABO/RH(D) PENDING    Antibody Screen PENDING    Sample Expiration      09/06/2022,2359 Performed at [facility], [address] [place], [place] [address]    Group B Strep  negative  Assessment   [id number] at [redacted]w[redacted]d Estimated Date of Delivery: 08/26/22  Reassuring maternal/fetal status.  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 Admit to L&D: pitocin titration for IOL 2. EFM: CEFM 3. Pharmacologic pain relief if desired.   4. Admission labs  5. Anticipate NSVD 6. MD notified of admission  Dominica Severin, [facility] 09/03/2022 9:13 AM

## 2022-09-03 NOTE — Anesthesia Preprocedure Evaluation (Signed)
Anesthesia Evaluation  Patient identified by MRN, date of birth, ID band Patient awake    Reviewed: Allergy & Precautions, H&P , NPO status , Patient's Chart, lab work & pertinent test results  Airway Mallampati: II       Dental no notable dental hx.    Pulmonary neg pulmonary ROS   Pulmonary exam normal        Cardiovascular negative cardio ROS Normal cardiovascular exam     Neuro/Psych  Headaches  negative psych ROS   GI/Hepatic negative GI ROS, Neg liver ROS,,,  Endo/Other  negative endocrine ROS    Renal/GU negative Renal ROS  negative genitourinary   Musculoskeletal   Abdominal   Peds  Hematology negative hematology ROS (+)   Anesthesia Other Findings   Reproductive/Obstetrics (+) Pregnancy                             Anesthesia Physical Anesthesia Plan  ASA: 2  Anesthesia Plan:    Post-op Pain Management:    Induction:   PONV Risk Score and Plan:   Airway Management Planned:   Additional Equipment:   Intra-op Plan:   Post-operative Plan:   Informed Consent:   Plan Discussed with: Anesthesiologist and CRNA  Anesthesia Plan Comments:        Anesthesia Quick Evaluation

## 2022-09-03 NOTE — Plan of Care (Signed)
  Problem: Education: Goal: Knowledge of Childbirth will improve Outcome: Progressing Goal: Ability to make informed decisions regarding treatment and plan of care will improve Outcome: Progressing Goal: Ability to state and carry out methods to decrease the pain will improve Outcome: Progressing  Problem: Coping: Goal: Ability to verbalize concerns and feelings about labor and delivery will improve Outcome: Progressing   Problem: Life Cycle: Goal: Ability to make normal progression through stages of labor will improve Outcome: Progressing Goal: Ability to effectively push during vaginal delivery will improve Outcome: Progressing   Problem: Role Relationship: Goal: Will demonstrate positive interactions with the child Outcome: Progressing   Problem: Safety: Goal: Risk of complications during labor and delivery will decrease Outcome: Progressing   Problem: Pain Management: Goal: Relief or control of pain from uterine contractions will improve Outcome: Progressing     

## 2022-09-03 NOTE — Anesthesia Procedure Notes (Signed)
Epidural Patient location during procedure: OB Start time: 09/03/2022 2:51 PM End time: 09/03/2022 2:57 PM  Staffing Anesthesiologist: Reed Breech, MD Resident/CRNA: Jeanine Luz, CRNA Performed: resident/CRNA   Preanesthetic Checklist Completed: patient identified, IV checked, site marked, risks and benefits discussed, surgical consent, monitors and equipment checked and pre-op evaluation  Epidural Patient position: sitting Prep: ChloraPrep Patient monitoring: heart rate, continuous pulse ox and blood pressure Approach: midline Location: L3-L4 Injection technique: LOR saline  Needle:  Needle type: Tuohy  Needle gauge: 17 G Needle length: 9 cm and 9 Needle insertion depth: 7 cm Catheter type: closed end flexible Catheter size: 19 Gauge Catheter at skin depth: 12 cm Test dose: negative and 1.5% lidocaine with Epi 1:200 K  Assessment Events: blood not aspirated, no cerebrospinal fluid, injection not painful, no injection resistance, no paresthesia and negative IV test  Additional Notes 2 attempt Pt. Evaluated and documentation done after procedure finished. Patient identified. Risks/Benefits/Options discussed with patient including but not limited to bleeding, infection, nerve damage, paralysis, failed block, incomplete pain control, headache, blood pressure changes, nausea, vomiting, reactions to medication both or allergic, itching and postpartum back pain. Confirmed with bedside nurse the patient's most recent platelet count. Confirmed with patient that they are not currently taking any anticoagulation, have any bleeding history or any family history of bleeding disorders. Patient expressed understanding and wished to proceed. All questions were answered. Sterile technique was used throughout the entire procedure. Please see nursing notes for vital signs. Test dose was given through epidural catheter and negative prior to continuing to dose epidural or start infusion.  Warning signs of high block given to the patient including shortness of breath, tingling/numbness in hands, complete motor block, or any concerning symptoms with instructions to call for help. Patient was given instructions on fall risk and not to get out of bed. All questions and concerns addressed with instructions to call with any issues or inadequate analgesia.    Patient tolerated the insertion well without immediate complications.Reason for block:procedure for pain

## 2022-09-03 NOTE — Progress Notes (Signed)
LABOR NOTE   SUBJECTIVE:   Miranda Prince is a 22 y.o.  [id number]  at [redacted]w[redacted]d, pain improving after epidural restarted. Turned off due to low blood pressure. Feeling occasional pressure with contractions.    Analgesia: Epidural  OBJECTIVE:  BP 107/75   Pulse 91   Temp 98 F (36.7 C) (Oral)   Resp 18   Ht 5\' 1"  (1.549 m)   Wt 93 kg   LMP 11/19/2021 (Exact Date)   SpO2 100%   BMI 38.73 kg/m  Total I/O In: -  Out: 950 [Urine:950]  SVE:   Dilation: 6.5 Effacement (%): 70 Station: -3 Exam by:: Keitha Butte CNM CONTRACTIONS: regular, every 2-3 minutes FHR: Fetal heart tracing reviewed. Baseline: 130 Variability: moderate Accelerations: present Decelerations:none Category I  Labs: Lab Results  Component Value Date   WBC 10.2 09/03/2022   HGB 10.2 (L) 09/03/2022   HCT 32.4 (L) 09/03/2022   MCV 76.8 (L) 09/03/2022   PLT 269 09/03/2022    ASSESSMENT: 1) Induction of labor due to postterm,  progressing well on pitocin     Coping: improved with resumption of epidural     Membranes: ruptured spontaneously, clear fluid@1858      GBS negative, no antibiotics indicated        Active Problems:   Postmaturity pregnancy, 40-[redacted] weeks gestation   Labor and delivery, indication for care   PLAN: continue present management Anticipate NSVD   Dominica Severin, CNM 09/03/2022 8:41 PM

## 2022-09-03 NOTE — Progress Notes (Signed)
LABOR NOTE   SUBJECTIVE:   Miranda Prince is a 22 y.o.  [id number]  at [redacted]w[redacted]d in induced labor. Comfortable after epidural    Analgesia: Epidural  OBJECTIVE:  BP (!) 108/57   Pulse 91   Temp 98.4 F (36.9 C)   Resp 18   Ht 5\' 1"  (1.549 m)   Wt 93 kg   LMP 11/19/2021 (Exact Date)   SpO2 100%   BMI 38.73 kg/m  No intake/output data recorded.  SVE:   Dilation: 6 Effacement (%): 60 Station: -3 Exam by:: Keitha Butte CNM CONTRACTIONS: regular, every 1.5-5 minutes FHR: Fetal heart tracing reviewed. Baseline: 130 Variability: moderate Accelerations: present Decelerations:none Category I  Labs: Lab Results  Component Value Date   WBC 10.2 09/03/2022   HGB 10.2 (L) 09/03/2022   HCT 32.4 (L) 09/03/2022   MCV 76.8 (L) 09/03/2022   PLT 269 09/03/2022    ASSESSMENT: 1) Induction of labor due to postterm,  progressing well on pitocin. Cervix posterior, head not well applied.     Coping: comfortable with epidural     Membranes: intact     GBS negative, no antibiotics indicated.        Active Problems:   Postmaturity pregnancy, 40-[redacted] weeks gestation   Labor and delivery, indication for care   PLAN: AROM with descent of fetal head Anticipate NSVD   Dominica Severin, CNM 09/03/2022 4:17 PM

## 2022-09-04 ENCOUNTER — Encounter: Payer: Self-pay | Admitting: Certified Nurse Midwife

## 2022-09-04 DIAGNOSIS — O2653 Maternal hypotension syndrome, third trimester: Secondary | ICD-10-CM

## 2022-09-04 DIAGNOSIS — Z3A41 41 weeks gestation of pregnancy: Secondary | ICD-10-CM | POA: Diagnosis not present

## 2022-09-04 DIAGNOSIS — O480 Post-term pregnancy: Principal | ICD-10-CM

## 2022-09-04 MED ORDER — MISOPROSTOL 25 MCG QUARTER TABLET: 200.0000 ug | ORAL_TABLET | Freq: Once | ORAL | Status: DC

## 2022-09-04 MED ORDER — ONDANSETRON HCL 4 MG PO TABS: 4.0000 mg | ORAL_TABLET | ORAL | Status: DC | PRN

## 2022-09-04 MED ORDER — MISOPROSTOL 25 MCG QUARTER TABLET: 50.0000 ug | ORAL_TABLET | Freq: Once | ORAL | Status: DC

## 2022-09-04 MED ORDER — FLEET ENEMA 7-19 GM/118ML RE ENEM: 1.0000 | ENEMA | Freq: Every day | RECTAL | Status: DC | PRN

## 2022-09-04 MED ORDER — DIPHENHYDRAMINE HCL 25 MG PO CAPS: 25.0000 mg | ORAL_CAPSULE | Freq: Four times a day (QID) | ORAL | Status: DC | PRN

## 2022-09-04 MED ORDER — COCONUT OIL OIL
1.0000 | TOPICAL_OIL | Status: DC | PRN
  Filled 2022-09-04: qty 22.5

## 2022-09-04 MED ORDER — TERBUTALINE SULFATE 1 MG/ML IJ SOLN: 0.2500 mg | Freq: Once | INTRAMUSCULAR | Status: DC | PRN

## 2022-09-04 MED ORDER — WITCH HAZEL-GLYCERIN EX PADS: 1.0000 | MEDICATED_PAD | CUTANEOUS | Status: DC | PRN

## 2022-09-04 MED ORDER — PRENATAL MULTIVITAMIN CH
1.0000 | ORAL_TABLET | Freq: Every day | ORAL | Status: DC
  Administered 2022-09-04 – 2022-09-05 (×2): 1 via ORAL
  Filled 2022-09-04 (×2): qty 1

## 2022-09-04 MED ORDER — SIMETHICONE 80 MG PO CHEW: 80.0000 mg | CHEWABLE_TABLET | ORAL | Status: DC | PRN

## 2022-09-04 MED ORDER — IBUPROFEN 600 MG PO TABS
600.0000 mg | ORAL_TABLET | Freq: Four times a day (QID) | ORAL | Status: DC
  Administered 2022-09-04 – 2022-09-05 (×6): 600 mg via ORAL
  Filled 2022-09-04 (×6): qty 1

## 2022-09-04 MED ORDER — BENZOCAINE-MENTHOL 20-0.5 % EX AERO: 1.0000 | INHALATION_SPRAY | CUTANEOUS | Status: DC | PRN

## 2022-09-04 MED ORDER — SENNOSIDES-DOCUSATE SODIUM 8.6-50 MG PO TABS
2.0000 | ORAL_TABLET | Freq: Every day | ORAL | Status: DC
  Administered 2022-09-05: 2 via ORAL
  Filled 2022-09-04: qty 2

## 2022-09-04 MED ORDER — DIBUCAINE (PERIANAL) 1 % EX OINT: 1.0000 | TOPICAL_OINTMENT | CUTANEOUS | Status: DC | PRN

## 2022-09-04 MED ORDER — CEFAZOLIN SODIUM-DEXTROSE 2-4 GM/100ML-% IV SOLN: 2.0000 g | Freq: Once | INTRAVENOUS | Status: DC

## 2022-09-04 MED ORDER — ONDANSETRON HCL 4 MG/2ML IJ SOLN: 4.0000 mg | INTRAMUSCULAR | Status: DC | PRN

## 2022-09-04 MED ORDER — MISOPROSTOL 200 MCG PO TABS: 800.0000 ug | ORAL_TABLET | Freq: Once | ORAL | Status: AC

## 2022-09-04 MED ORDER — BISACODYL 10 MG RE SUPP: 10.0000 mg | Freq: Every day | RECTAL | Status: DC | PRN

## 2022-09-04 MED ORDER — ZOLPIDEM TARTRATE 5 MG PO TABS: 5.0000 mg | ORAL_TABLET | Freq: Every evening | ORAL | Status: DC | PRN

## 2022-09-04 MED ORDER — ACETAMINOPHEN 325 MG PO TABS
650.0000 mg | ORAL_TABLET | ORAL | Status: DC | PRN
  Administered 2022-09-04: 650 mg via ORAL
  Filled 2022-09-04: qty 2

## 2022-09-04 NOTE — Discharge Summary (Signed)
Postpartum Discharge Summary    Patient Name: Miranda Prince DOB: 09-14-2000 MRN: [id number]  Date of admission: 09/03/2022 Delivery date:09/04/2022  Delivering provider: Dominica Severin  Date of discharge: 09/05/2022  Admitting diagnosis: Labor and delivery, indication for care [O75.9] Intrauterine pregnancy: [redacted]w[redacted]d     Secondary diagnosis:  Active Problems:   Postmaturity pregnancy, 40-[redacted] weeks gestation   Labor and delivery, indication for care   Postpartum care following vaginal delivery   Retained placenta or amniotic membrane after delivery without hemorrhage  Additional problems:  Postpartum Anemia requiring iron transfusion - PPD1 Hgb 7.3, iron transfusion given prior to discharge.  - PO iron Rx provided     Discharge diagnosis: Term Pregnancy Delivered                                              Post partum procedures: IV Fe transfusion Augmentation: Pitocin Complications: Shoulder dystocia (< 60s)  Hospital course: Induction of Labor With Vaginal Delivery   22 y.o. yo [phone] at [redacted]w[redacted]d was admitted to the hospital 09/03/2022 for induction of labor.  Indication for induction: Postdates.  Patient had an uncomplicated labor course.  Membrane Rupture Time/Date: 6:58 PM ,09/03/2022   Delivery Method:Vaginal, Spontaneous  Episiotomy: None  Lacerations:  None  Details of delivery can be found in separate delivery note.  Patient had a postpartum course complicated by anemia, IV Fe transfusion prior to discharge. Patient is discharged home 09/05/22.  Newborn Data: Birth date:09/04/2022  Birth time:3:09 AM  Gender:Female  Living status:Living  Apgars:7 ,9  Weight:3980 g   Magnesium Sulfate received: No BMZ received: No Rhophylac:N/A MMR:N/A T-DaP:Given prenatally Flu: N/A Transfusion: IV Fe  Physical exam  Vitals:   09/04/22 1647 09/04/22 1912 09/04/22 2355 09/05/22 0930  BP: 109/60 (!) 105/59 (!) 98/50 (!) 102/56  Pulse: 98 (!) 107 (!) 107 100  Resp: 20 20  20 18   Temp: 98 F (36.7 C) 97.9 F (36.6 C) 98.1 F (36.7 C) 98.5 F (36.9 C)  TempSrc: Oral Oral Oral Oral  SpO2: 97% 100% 99% 100%  Weight:      Height:       General: alert and no distress Lochia: appropriate Uterine Fundus: firm DVT Evaluation: No evidence of DVT seen on physical exam. Labs: Lab Results  Component Value Date   WBC 13.1 (H) 09/05/2022   HGB 7.3 (L) 09/05/2022   HCT 23.8 (L) 09/05/2022   MCV 80.7 09/05/2022   PLT 209 09/05/2022      Latest Ref Rng & Units 06/06/2021    1:10 PM  CMP  Glucose 70 - 99 mg/dL 93   BUN 6 - 20 mg/dL 11   Creatinine 2.95 - 1.00 mg/dL 2.84   Sodium 132 - 440 mmol/L 135   Potassium 3.5 - 5.1 mmol/L 3.8   Chloride 98 - 111 mmol/L 102   CO2 22 - 32 mmol/L 26   Calcium 8.9 - 10.3 mg/dL 9.1    Edinburgh Score:    09/05/2022    8:39 AM  Edinburgh Postnatal Depression Scale Screening Tool  I have been able to laugh and see the funny side of things. 0  I have looked forward with enjoyment to things. 0  I have blamed myself unnecessarily when things went wrong. 1  I have been anxious or worried for no good reason. 2  I have  felt scared or panicky for no good reason. 0  Things have been getting on top of me. 0  I have been so unhappy that I have had difficulty sleeping. 0  I have felt sad or miserable. 0  I have been so unhappy that I have been crying. 0  The thought of harming myself has occurred to me. 0  Edinburgh Postnatal Depression Scale Total 3      After visit meds:  Allergies as of 09/05/2022   No Known Allergies      Medication List     TAKE these medications    acetaminophen 325 MG tablet Commonly known as: Tylenol Take 2 tablets (650 mg total) by mouth every 4 (four) hours as needed (for pain scale < 4).   calcium carbonate 750 MG chewable tablet Commonly known as: TUMS EX Chew 1 tablet by mouth daily.   ferrous sulfate 325 (65 FE) MG EC tablet Take 1 tablet (325 mg total) by mouth daily.    ibuprofen 600 MG tablet Commonly known as: ADVIL Take 1 tablet (600 mg total) by mouth every 6 (six) hours.   PRENATAL ADULT GUMMY/DHA/FA PO Take by mouth.   sertraline 50 MG tablet Commonly known as: Zoloft Take 1 tablet (50 mg total) by mouth daily.         Discharge home in stable condition Infant Feeding: Breast Infant Disposition:home with mother Discharge instruction: per After Visit Summary and Postpartum booklet. Activity: Advance as tolerated. Pelvic rest for 6 weeks.  Diet: routine diet Anticipated Birth Control:  TBD Postpartum Appointment:6 weeks  Additional Postpartum F/U: Postpartum Depression checkup in 2 weeks Future Appointments: Future Appointments  Date Time Provider Department Center  12/10/2022  4:00 PM Van Clines, MD [facility] None   Follow up Visit:  Follow-up Information     Dominica Severin, CNM Follow up in 2 week(s).   Specialty: Certified Nurse Midwife Why: postpartum video visit for mood check Contact information: [address] [place] [address] [phone]         [facility] Follow up in 6 week(s).   Specialty: Obstetrics and Gynecology Why: in person postpartum visit Contact information: [address] [place] [address] [phone]                    09/05/2022 Lindalou Hose Shamia Uppal, CNM

## 2022-09-04 NOTE — Progress Notes (Addendum)
Post Partum Day 0 Subjective: Miranda Prince is feeling well overall. She is ambulating, voiding, and tolerating POs without difficulty. Her pain is well-controlled and her bleeding is WNL. Her mood is stable. Breastfeeding is going well.   Objective: Blood pressure 118/68, pulse 100, temperature 98.7 F (37.1 C), temperature source Oral, resp. rate 18, height 5\' 1"  (1.549 m), weight 93 kg, last menstrual period 11/19/2021, SpO2 100 %, unknown if currently breastfeeding.    Physical Exam:  General: alert, cooperative, and appears stated age Lochia: appropriate Uterine Fundus: firm Laceration: N/A DVT Evaluation: No evidence of DVT seen on physical exam.  Recent Labs    09/03/22 0835  HGB 10.2*  HCT 32.4*    Assessment/Plan: Plan for discharge tomorrow Lactation assistance as needed   LOS: 1 day   Glenetta Borg, CNM 09/04/2022, 11:55 AM

## 2022-09-04 NOTE — Progress Notes (Addendum)
LABOR NOTE   SUBJECTIVE:   Miranda Prince is a 22 y.o.  [phone]  at [redacted]w[redacted]d, comfortable with epidural, feeling pressure with contractions, desires to try to push   Analgesia: Epidural  OBJECTIVE:  BP 112/62   Pulse 84   Temp 97.9 F (36.6 C) (Axillary)   Resp 16   Ht 5\' 1"  (1.549 m)   Wt 93 kg   LMP 11/19/2021 (Exact Date)   SpO2 99%   BMI 38.73 kg/m  Total I/O In: -  Out: 950 [Urine:950]  SVE:   Dilation: Lip/rim Effacement (%): 100 Station: 0 Exam by:: Hartley Barefoot, CNM CONTRACTIONS: regular, every 1.5-3 minutes FHR: Fetal heart tracing reviewed. Baseline: 140 Variability: moderate Accelerations: present Decelerations: early Category I  Labs: Lab Results  Component Value Date   WBC 10.2 09/03/2022   HGB 10.2 (L) 09/03/2022   HCT 32.4 (L) 09/03/2022   MCV 76.8 (L) 09/03/2022   PLT 269 09/03/2022    ASSESSMENT: 1) Induction of labor due to postterm,  progressing well on pitocin     Coping: epidural in place     Membranes: ruptured, clear fluid at 1858, no evidence of infection     GBS negative       Active Problems:   Postmaturity pregnancy, 40-[redacted] weeks gestation   Labor and delivery, indication for care   PLAN: Attempted to push with patient at her request while reducing lip, lip easily reduced but poorly coordinated pushing efforts at this time, labor down & continue pitocin titration and change maternal position, recheck in 1-2h or with increased vaginal pressure Anticipate NSVD   Dominica Severin, CNM 09/04/2022 12:57 AM

## 2022-09-04 NOTE — Lactation Note (Addendum)
This note was copied from a baby's chart. Lactation Consultation Note  Patient Name: Miranda PrinceG Date: 09/04/2022 Age:24 hours Reason for consult: Initial assessment;Term;RN request;Breastfeeding assistance   Maternal Data This is mom's 3rd baby, SVD. Mom is an experienced breastfeeding mother. Requested by care nurse to assist mom with breastfeeding.  Has patient been taught Hand Expression?: Yes Does the patient have breastfeeding experience prior to this delivery?: Yes How long did the patient breastfeed?: 1st baby 2-3 months, 2nd baby 5-6 months  Feeding Mother's Current Feeding Choice: Breast Milk and Formula In to room to assist mom with breastfeeding. Mom had already independently latched baby well in cradle hold. Baby was latched well, audible swallows noted.  LATCH Score Latch: Grasps breast easily, tongue down, lips flanged, rhythmical sucking.  Audible Swallowing: Spontaneous and intermittent  Type of Nipple: Everted at rest and after stimulation  Comfort (Breast/Nipple): Soft / non-tender  Hold (Positioning): No assistance needed to correctly position infant at breast.  LATCH Score: 10   Interventions Interventions: Breast feeding basics reviewed;Education  Discharge Pump: Personal  Consult Status Consult Status: Follow-up Date: 09/05/22 Follow-up type: In-patient  Update provided to care nurse.  Fuller Song 09/04/2022, 1:48 PM

## 2022-09-05 LAB — CBC
HCT: 23.8 % — ABNORMAL LOW (ref 36.0–46.0)
Hemoglobin: 7.3 g/dL — ABNORMAL LOW (ref 12.0–15.0)
MCH: 24.7 pg — ABNORMAL LOW (ref 26.0–34.0)
MCHC: 30.7 g/dL (ref 30.0–36.0)
MCV: 80.7 fL (ref 80.0–100.0)
Platelets: 209 10*3/uL (ref 150–400)
RBC: 2.95 MIL/uL — ABNORMAL LOW (ref 3.87–5.11)
RDW: 15.5 % (ref 11.5–15.5)
WBC: 13.1 10*3/uL — ABNORMAL HIGH (ref 4.0–10.5)
nRBC: 0 % (ref 0.0–0.2)

## 2022-09-05 MED ORDER — FERROUS SULFATE 325 (65 FE) MG PO TBEC: 325.0000 mg | DELAYED_RELEASE_TABLET | Freq: Every day | ORAL | 3 refills | Status: DC

## 2022-09-05 MED ORDER — DIPHENHYDRAMINE HCL 50 MG/ML IJ SOLN: 25.0000 mg | Freq: Once | INTRAMUSCULAR | Status: DC | PRN

## 2022-09-05 MED ORDER — ALBUTEROL SULFATE (2.5 MG/3ML) 0.083% IN NEBU: 2.5000 mg | INHALATION_SOLUTION | Freq: Once | RESPIRATORY_TRACT | Status: DC | PRN

## 2022-09-05 MED ORDER — SODIUM CHLORIDE 0.9 % IV SOLN
25.0000 mg | Freq: Once | INTRAVENOUS | Status: AC
  Administered 2022-09-05: 25 mg via INTRAVENOUS
  Filled 2022-09-05: qty 0.5

## 2022-09-05 MED ORDER — SODIUM CHLORIDE 0.9 % IV SOLN
1000.0000 mg | Freq: Once | INTRAVENOUS | Status: AC
  Administered 2022-09-05: 1000 mg via INTRAVENOUS
  Filled 2022-09-05: qty 20

## 2022-09-05 MED ORDER — EPINEPHRINE PF 1 MG/ML IJ SOLN: 0.3000 mg | Freq: Once | INTRAMUSCULAR | Status: DC | PRN

## 2022-09-05 MED ORDER — METHYLPREDNISOLONE SODIUM SUCC 125 MG IJ SOLR: 125.0000 mg | Freq: Once | INTRAMUSCULAR | Status: DC | PRN

## 2022-09-05 MED ORDER — SODIUM CHLORIDE 0.9 % IV SOLN: INTRAVENOUS | Status: DC | PRN

## 2022-09-05 MED ORDER — IBUPROFEN 600 MG PO TABS: 600.0000 mg | ORAL_TABLET | Freq: Four times a day (QID) | ORAL | 0 refills | Status: DC

## 2022-09-05 MED ORDER — SODIUM CHLORIDE 0.9 % IV BOLUS: 500.0000 mL | Freq: Once | INTRAVENOUS | Status: DC | PRN

## 2022-09-05 NOTE — Anesthesia Postprocedure Evaluation (Signed)
Anesthesia Post Note  Patient: Miranda Prince  Procedure(s) Performed: AN AD HOC LABOR EPIDURAL  Patient location during evaluation: Mother Baby Anesthesia Type: Epidural Level of consciousness: awake, awake and alert and oriented Pain management: pain level controlled Vital Signs Assessment: post-procedure vital signs reviewed and stable Respiratory status: spontaneous breathing, nonlabored ventilation and respiratory function stable Cardiovascular status: blood pressure returned to baseline and stable Postop Assessment: no headache and no backache Anesthetic complications: no  No notable events documented.   Last Vitals:  Vitals:   09/04/22 1912 09/04/22 2355  BP: (!) 105/59 (!) 98/50  Pulse: (!) 107 (!) 107  Resp: 20 20  Temp: 36.6 C 36.7 C  SpO2: 100% 99%    Last Pain:  Vitals:   09/04/22 2355  TempSrc: Oral  PainSc:                  [name]

## 2022-09-05 NOTE — Progress Notes (Signed)
Pt discharged with infant.  Discharge instructions, prescriptions and follow up appointment given to and reviewed with pt. Pt verbalized understanding. Escorted out by staff. 

## 2022-09-05 NOTE — Lactation Note (Signed)
This note was copied from a baby's chart. Lactation Consultation Note  Patient Name: Miranda Prince [id number] Date: 09/05/2022 Age:24 hours Reason for consult: Follow-up assessment;Term   Maternal Data This is mom's 3rd baby, SVD. Mom is an experienced breastfeeding mother.  On follow-up today mom reports her nipples are a tender, no cracks noted.  Has patient been taught Hand Expression?: Yes Does the patient have breastfeeding experience prior to this delivery?: Yes How long did the patient breastfeed?: 1st baby 2-3 months, 2nd baby 5-6 months  Feeding Mother's Current Feeding Choice: Breast Milk and Formula  Mom reports baby is latching and breastfeeding well. Baby has recently had a circ and is sleeping in the basinet. Interventions Interventions: Education, coconut oil for sore nipple management.  Discharge Discharge Education: Engorgement and breast care;Warning signs for feeding baby;Outpatient recommendation Pump: Personal  Consult Status Consult Status: Complete Date: 09/05/22 Follow-up type: In-patient  Update provided to care nurse.  Fuller Song 09/05/2022, 2:35 PM

## 2022-09-07 ENCOUNTER — Encounter: Payer: Self-pay | Admitting: Radiology

## 2022-09-07 ENCOUNTER — Emergency Department
Admission: EM | Admit: 2022-09-07 | Discharge: 2022-09-07 | Disposition: A | Discharge status: Home / Self Care | Payer: Medicaid Other | Attending: Emergency Medicine | Admitting: Emergency Medicine

## 2022-09-07 ENCOUNTER — Other Ambulatory Visit: Payer: Self-pay

## 2022-09-07 DIAGNOSIS — N898 Other specified noninflammatory disorders of vagina: Secondary | ICD-10-CM | POA: Diagnosis present

## 2022-09-07 LAB — COMPREHENSIVE METABOLIC PANEL
ALT: 18 U/L (ref 0–44)
AST: 28 U/L (ref 15–41)
Albumin: 2.8 g/dL — ABNORMAL LOW (ref 3.5–5.0)
Alkaline Phosphatase: 125 U/L (ref 38–126)
Anion gap: 10 (ref 5–15)
BUN: 8 mg/dL (ref 6–20)
CO2: 23 mmol/L (ref 22–32)
Calcium: 9 mg/dL (ref 8.9–10.3)
Chloride: 105 mmol/L (ref 98–111)
Creatinine, Ser: 0.55 mg/dL (ref 0.44–1.00)
GFR, Estimated: >60 mL/min (ref >60)
Glucose, Bld: 82 mg/dL (ref 70–99)
Potassium: 3.8 mmol/L (ref 3.5–5.1)
Sodium: 138 mmol/L (ref 135–145)
Total Bilirubin: 0.4 mg/dL (ref 0.3–1.2)
Total Protein: 6.7 g/dL (ref 6.5–8.1)

## 2022-09-07 LAB — CBC WITH DIFFERENTIAL/PLATELET
Abs Immature Granulocytes: 0.49 10*3/uL — ABNORMAL HIGH (ref 0.00–0.07)
Basophils Absolute: 0.1 10*3/uL (ref 0.0–0.1)
Basophils Relative: 0 %
Eosinophils Absolute: 0.4 10*3/uL (ref 0.0–0.5)
Eosinophils Relative: 3 %
HCT: 26.8 % — ABNORMAL LOW (ref 36.0–46.0)
Hemoglobin: 8.2 g/dL — ABNORMAL LOW (ref 12.0–15.0)
Immature Granulocytes: 4 %
Lymphocytes Relative: 9 %
Lymphs Abs: 1.3 10*3/uL (ref 0.7–4.0)
MCH: 24.9 pg — ABNORMAL LOW (ref 26.0–34.0)
MCHC: 30.6 g/dL (ref 30.0–36.0)
MCV: 81.5 fL (ref 80.0–100.0)
Monocytes Absolute: 0.7 10*3/uL (ref 0.1–1.0)
Monocytes Relative: 5 %
Neutro Abs: 10.8 10*3/uL — ABNORMAL HIGH (ref 1.7–7.7)
Neutrophils Relative %: 79 %
Platelets: 272 10*3/uL (ref 150–400)
RBC: 3.29 MIL/uL — ABNORMAL LOW (ref 3.87–5.11)
RDW: 15.9 % — ABNORMAL HIGH (ref 11.5–15.5)
WBC: 13.7 10*3/uL — ABNORMAL HIGH (ref 4.0–10.5)
nRBC: 0.1 % (ref 0.0–0.2)

## 2022-09-07 NOTE — ED Triage Notes (Signed)
Pt states she gave birth last week 09/04/2022. Vaginal delivery. Pt states that today when she woke and went to the bathroom there was something hanging out of her that was not a blood clot. Pt states that it did not freely come off. Pt states she didn't pull it out, just came here. Pt endorses feeling very cold this morning, without fever.

## 2022-09-07 NOTE — Consult Note (Signed)
Reason for Consult: patient presents to ER with tissue protruding from vagina Referring Physician: Dr Alcide Clever Miranda Prince is an 22 y.o. female 3 days postpartum. She noticed white tissue coming from her vagina this morning when she was on the toilet. Her bleeding is like period flow. She denies pain or fever. She is breastfeeding.  Pertinent Gynecological History:  Bleeding: normal postpartum bleeding Contraception: postpartum abstinence  Blood transfusions:  iron infusion postpartum  Last pap: normal Date: 01/19/20 OB History: G4, P3013   Menstrual History:  Last LMP 11/19/21    Past Medical History:  Diagnosis Date   Headache    History of premature rupture of membranes 04/08/2017   Labor and delivery, indication for care 07/12/2019    History reviewed. No pertinent surgical history.  Family History  Problem Relation Age of Onset   Diabetes Mother    Seizures Maternal Grandmother    Stroke Paternal Grandfather    Diabetes Maternal Uncle    Depression Paternal Aunt    Asthma Paternal Aunt    COPD Paternal Aunt    Anxiety disorder Paternal Aunt    Diabetes Other    Cancer Other     Social History:  reports that she has never smoked. She has never used smokeless tobacco. She reports that she does not drink alcohol and does not use drugs.  Allergies: No Known Allergies  Medications: I have reviewed the patient's current medications.  Review of Systems  Constitutional:  Negative for chills and fever.  HENT:  Negative for congestion, ear discharge, ear pain, hearing loss, sinus pain and sore throat.   Eyes:  Negative for blurred vision and double vision.  Respiratory:  Negative for cough, shortness of breath and wheezing.   Cardiovascular:  Negative for chest pain, palpitations and leg swelling.  Gastrointestinal:  Negative for abdominal pain, blood in stool, constipation, diarrhea, heartburn, melena, nausea and vomiting.  Genitourinary:  Negative for  dysuria, flank pain, frequency, hematuria and urgency.       Positive for abnormal tissue in vagina  Musculoskeletal:  Negative for back pain, joint pain and myalgias.  Skin:  Negative for itching and rash.  Neurological:  Negative for dizziness, tingling, tremors, sensory change, speech change, focal weakness, seizures, loss of consciousness, weakness and headaches.  Endo/Heme/Allergies:  Negative for environmental allergies. Does not bruise/bleed easily.  Psychiatric/Behavioral:  Negative for depression, hallucinations, memory loss, substance abuse and suicidal ideas. The patient is not nervous/anxious and does not have insomnia.     Blood pressure 109/70, pulse 97, temperature 98.2 F (36.8 C), temperature source Oral, resp. rate 16, height 5\' 1"  (1.549 m), weight 93 kg, SpO2 97 %, currently breastfeeding.  Constitutional: Well nourished, well developed female in no acute distress.  HEENT: normal Skin: Warm and dry.  Cardiovascular: Regular rate and rhythm.   Extremity:  no edema   Respiratory:  Normal respiratory effort Abdomen: soft, nontender, nondistended Psych: Alert and Oriented x3. No memory deficits. Normal mood and affect.    Pelvic exam: (female chaperone present) is not limited by body habitus EGBUS: within normal limits Vagina: within normal limits and with normal mucosa, blood in the vault Cervix: membranes seen in vagina and what appears to be bulging bag of  membranes at cervical os  Sterile speculum exam: large Q tips used to clear away some blood from cervix, approximately 10 inch long strand of membranes including fluid filled pocket that had been bulging at the os were teased out of uterus through  cervix using 2 ring forceps. Appears to have come out intact and sent to pathology. Patient tolerated procedure well.    Results for orders placed or performed during the hospital encounter of 09/07/22 (from the past 48 hour(s))  CBC with Differential     Status:  Abnormal   Collection Time: 09/07/22 12:37 PM  Result Value Ref Range   WBC 13.7 (H) 4.0 - 10.5 K/uL   RBC 3.29 (L) 3.87 - 5.11 MIL/uL   Hemoglobin 8.2 (L) 12.0 - 15.0 g/dL   HCT 16.1 (L) 09.6 - 04.5 %   MCV 81.5 80.0 - 100.0 fL   MCH 24.9 (L) 26.0 - 34.0 pg   MCHC 30.6 30.0 - 36.0 g/dL   RDW 40.9 (H) 81.1 - 91.4 %   Platelets 272 150 - 400 K/uL   nRBC 0.1 0.0 - 0.2 %   Neutrophils Relative % 79 %   Neutro Abs 10.8 (H) 1.7 - 7.7 K/uL   Lymphocytes Relative 9 %   Lymphs Abs 1.3 0.7 - 4.0 K/uL   Monocytes Relative 5 %   Monocytes Absolute 0.7 0.1 - 1.0 K/uL   Eosinophils Relative 3 %   Eosinophils Absolute 0.4 0.0 - 0.5 K/uL   Basophils Relative 0 %   Basophils Absolute 0.1 0.0 - 0.1 K/uL   Immature Granulocytes 4 %   Abs Immature Granulocytes 0.49 (H) 0.00 - 0.07 K/uL    Comment: Performed at [facility], [address] [place], [place] [address]  Comprehensive metabolic panel     Status: Abnormal   Collection Time: 09/07/22 12:37 PM  Result Value Ref Range   Sodium 138 135 - 145 mmol/L   Potassium 3.8 3.5 - 5.1 mmol/L   Chloride 105 98 - 111 mmol/L   CO2 23 22 - 32 mmol/L   Glucose, Bld 82 70 - 99 mg/dL    Comment: Glucose reference range applies only to samples taken after fasting for at least 8 hours.   BUN 8 6 - 20 mg/dL   Creatinine, Ser 6.21 0.44 - 1.00 mg/dL   Calcium 9.0 8.9 - 30.8 mg/dL   Total Protein 6.7 6.5 - 8.1 g/dL   Albumin 2.8 (L) 3.5 - 5.0 g/dL   AST 28 15 - 41 U/L   ALT 18 0 - 44 U/L   Alkaline Phosphatase 125 38 - 126 U/L   Total Bilirubin 0.4 0.3 - 1.2 mg/dL   GFR, Estimated >65 >78 mL/min    Comment: (NOTE) Calculated using the CKD-EPI Creatinine Equation (2021)    Anion gap 10 5 - 15    Comment: Performed at [facility], [address], [place], [place] [address]     Assessment/Plan: 22 y.o. G71 P67 female with retained membranes, successfully removed  Follow up as needed and for scheduled postpartum  visits   Miranda Prince, [place] 09/07/2022

## 2022-09-07 NOTE — ED Notes (Signed)
Blue top Purple top Lite green top  Sent to lab

## 2022-09-07 NOTE — ED Notes (Signed)
Mother baby contacted and they advise that she be evaluated in the ED.

## 2022-09-07 NOTE — Discharge Instructions (Signed)
You had a retained membrane in the vagina which has been removed.  Her blood work was reassuring.  If you develop heavy bleeding such as bleeding through a pad more frequently than once every 2 hours or worsening abdominal pain or fever then please return to the emergency department.

## 2022-09-07 NOTE — ED Provider Notes (Signed)
[facility] Provider Note    Event Date/Time   First MD Initiated Contact with Patient 09/07/22 1241     (approximate)   History   Vaginal Discharge   HPI  Miranda Prince is a 22 y.o. female G4, P4 who presents with mass protruding from the vagina.  Patient had a vaginal delivery on 6/18.  Partum course was complicated by anemia requiring IV iron transfusion.  Discharged 2 days ago.  She has had some bleeding thus less heavy than the menstrual period since discharge.  No significant abdominal pain no fever.  Today patient noticed a mass coming from her vagina.  Said that it was like a white string and then there was blood hanging from it.  It was at her vagina.  She was scared so came right to the ER.  It did not pass.     Past Medical History:  Diagnosis Date   Headache    History of premature rupture of membranes 04/08/2017   Labor and delivery, indication for care 07/12/2019    Patient Active Problem List   Diagnosis Date Noted   Postpartum care following vaginal delivery 09/04/2022   Retained placenta or amniotic membrane after delivery without hemorrhage 09/04/2022   Labor and delivery, indication for care 09/03/2022   Postmaturity pregnancy, 40-[redacted] weeks gestation 08/27/2022   [redacted] weeks gestation of pregnancy 08/27/2022   Supervision of other normal pregnancy, antepartum 01/08/2022   E-coli UTI 12/15/2018   Migraine without aura and without status migrainosus, not intractable 06/10/2016   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 --      Excl. in GC? --     Most recent vital signs: Vitals:   09/07/22 1225  BP: 111/72  Pulse: (!) 110  Resp: 17  Temp: 98.2 F (36.8 C)  SpO2: 97%     General: Awake, no distress.  CV:  Good peripheral perfusion.  Resp:  Normal effort.  Abd:  No distention.  Abdomen is soft and nontender Neuro:             Awake, Alert, Oriented x 3  Other:  No masses on external exam, on speculum exam there is mass with purplish hue does appear to have membrane surrounding it, unable to visualize the cervix, no active bleeding   ED Results / Procedures / Treatments  Labs (all labs ordered are listed, but only abnormal results are displayed) Labs Reviewed  CBC WITH DIFFERENTIAL/PLATELET - Abnormal; Notable for the following components:      Result Value   WBC 13.7 (*)    RBC 3.29 (*)    Hemoglobin 8.2 (*)    HCT 26.8 (*)    MCH 24.9 (*)    RDW 15.9 (*)  Neutro Abs 10.8 (*)    Abs Immature Granulocytes 0.49 (*)    All other components within normal limits  COMPREHENSIVE METABOLIC PANEL - Abnormal; Notable for the following components:   Albumin 2.8 (*)    All other components within normal limits  SURGICAL PATHOLOGY     EKG     RADIOLOGY    PROCEDURES:  Critical Care performed: No  Procedures    MEDICATIONS ORDERED IN ED: Medications - No data to display   IMPRESSION / MDM / ASSESSMENT AND PLAN / ED COURSE  I reviewed the triage vital signs and the nursing notes.                              Patient's presentation is most consistent with acute complicated illness / injury requiring diagnostic workup.  Differential diagnosis includes, but is not limited to, retained products, prolapsing fibroid, uterine prolapse  22 year old female who is 3 days status post a spontaneous vaginal delivery presents because there was a mass protruding from her vagina.  She has had what sounds like expected bleeding since giving birth and then  today noticed something protruding from the vagina and immediately came to the ER.  She is mildly tachycardic.  Labs show stable hemoglobin.  Abdominal exam is benign.  I do not see anything coming from the vagina on external exam but on speculum exam there is a mass with a bluish hue that does appear to have a surrounding membrane.  I not sure if this is a retained product or prolapsing fibroid.  I discussed with Tresea Mall nurse midwife at [facility] who will come see the patient.  Patient was evaluated by nurse midwife Evlyn Courier who was able to remove what she believes were retained membranes.  She did send these to surgical pathology.  Does not feel that patient needs any further workup at this time.  Discussed return precautions for heavy bleeding.  Patient appropriate for discharge.     FINAL CLINICAL IMPRESSION(S) / ED DIAGNOSES   Final diagnoses:  Retained placenta or membranes     Rx / DC Orders   ED Discharge Orders     None        Note:  This document was prepared using Dragon voice recognition software and may include unintentional dictation errors.   Georga Hacking, MD 09/07/22 [phone]

## 2022-09-16 DIAGNOSIS — Z419 Encounter for procedure for purposes other than remedying health state, unspecified: Secondary | ICD-10-CM | POA: Diagnosis not present

## 2022-10-17 DIAGNOSIS — Z419 Encounter for procedure for purposes other than remedying health state, unspecified: Secondary | ICD-10-CM | POA: Diagnosis not present

## 2022-10-31 ENCOUNTER — Encounter: Payer: Self-pay | Admitting: Certified Nurse Midwife

## 2022-10-31 ENCOUNTER — Ambulatory Visit (INDEPENDENT_AMBULATORY_CARE_PROVIDER_SITE_OTHER): Payer: Medicaid Other | Admitting: Certified Nurse Midwife

## 2022-10-31 DIAGNOSIS — Z3009 Encounter for other general counseling and advice on contraception: Secondary | ICD-10-CM

## 2022-10-31 NOTE — Progress Notes (Signed)
Post Partum Visit Note  Miranda Prince is a 22 y.o. [phone] female who presents for a postpartum visit. She is 8 weeks postpartum following a normal spontaneous vaginal delivery.  I have fully reviewed the prenatal and intrapartum course. The delivery was at 41 gestational weeks.  Anesthesia: epidural. Postpartum course was complicated by retained membrane removed in ED, no endometritis. Menstrual cycle returned 10/14/22. Baby is doing well. Baby is feeding by bottle. Bleeding no bleeding. Bowel function is normal. Bladder function is normal. Patient is sexually active. Contraception method is  vasectomy for partner scheduled next month, condoms until complete . Postpartum depression screening: negative.   Upstream - 10/31/22 1310       Pregnancy Intention Screening   Does the patient want to become pregnant in the next year? No    Does the patient's partner want to become pregnant in the next year? No      Contraception Wrap Up   Current Method No Method - Other Reason    End Method No Method - Other Reason    Contraception Counseling Provided No            The pregnancy intention screening data noted above was reviewed. Potential methods of contraception were discussed. The patient elected to proceed with No Method - Other Reason.   Edinburgh Postnatal Depression Scale - 10/31/22 1326       Edinburgh Postnatal Depression Scale:  In the Past 7 Days   I have been able to laugh and see the funny side of things. 0    I have looked forward with enjoyment to things. 0    I have blamed myself unnecessarily when things went wrong. 1    I have been anxious or worried for no good reason. 2    I have felt scared or panicky for no good reason. 0    Things have been getting on top of me. 0    I have been so unhappy that I have had difficulty sleeping. 0    I have felt sad or miserable. 0    I have been so unhappy that I have been crying. 0    The thought of harming myself has  occurred to me. 0    Edinburgh Postnatal Depression Scale Total 3             Health Maintenance Due  Topic Date Due   HPV VACCINES (2 - 3-dose series) 06/25/2018   COVID-19 Vaccine (1 - 2023-24 season) Never done   INFLUENZA VACCINE  10/17/2022    The following portions of the patient's history were reviewed and updated as appropriate: allergies, current medications, past family history, past medical history, past social history, past surgical history, and problem list.  Review of Systems Pertinent items are noted in HPI.  Objective:  BP 98/71   Pulse 95   Ht 5\' 1"  (1.549 m)   Wt 188 lb (85.3 kg)   LMP 10/14/2022   Breastfeeding No   BMI 35.52 kg/m    General:  alert, cooperative, and appears stated age   Breasts:  not indicated  Lungs: clear to auscultation bilaterally  Heart:  regular rate and rhythm, S1, S2 normal, no murmur, click, rub or gallop  Abdomen: normal findings: no organomegaly, soft, non-tender, and DR<32fb         Assessment:    1. Routine postpartum follow-up - Normal involution & return of menses.    2. Encounter for counseling regarding contraception -  partner vasectomy scheduled, condoms until complete.   Normal postpartum exam.   Plan:   Essential components of care per ACOG recommendations:  1.  Mood and well being: Patient with negative depression screening today.   2. Infant care and feeding:  -Patient currently breastmilk feeding? No.  -Social determinants of health (SDOH) reviewed in EPIC. No concerns  3. Sexuality, contraception and birth spacing - Patient does not want a pregnancy in the next year.  Desired family size is 2 children.  - Reviewed reproductive life planning. Reviewed contraceptive methods based on pt preferences and effectiveness.  Patient desired Vasectomy today.     4. Sleep and fatigue -Encouraged family/partner/community support of 4 hrs of uninterrupted sleep to help with mood and fatigue  5. Physical  Recovery  - Discussed patients delivery and complications. She describes her labor as good. - Patient had a Vaginal problems after delivery including retained amniotic membrane, shoulder dystocia . Patient had no laceration. Perineal healing reviewed. Patient expressed understanding - Patient has urinary incontinence? No. - Patient is safe to resume physical and sexual activity  6.  Health Maintenance - HM due items addressed Yes - Last pap smear  Diagnosis  Date Value Ref Range Status  01/19/2020      - Negative for intraepithelial lesion or malignancy (NILM)   Pap smear not done at today's visit. Recommend Annual visit with pap in November. Declines pap today.  -Breast Cancer screening indicated? No.   7. Chronic Disease/Pregnancy Condition follow up: None  - PCP follow up  Dominica Severin, CNM [facility], [facility]

## 2022-11-17 DIAGNOSIS — Z419 Encounter for procedure for purposes other than remedying health state, unspecified: Secondary | ICD-10-CM | POA: Diagnosis not present

## 2022-12-07 DIAGNOSIS — R1013 Epigastric pain: Secondary | ICD-10-CM | POA: Diagnosis not present

## 2022-12-10 ENCOUNTER — Encounter: Payer: Self-pay | Admitting: Neurology

## 2022-12-10 ENCOUNTER — Telehealth: Payer: Medicaid Other | Admitting: Neurology

## 2022-12-10 VITALS — Ht 61.0 in | Wt 190.0 lb

## 2022-12-10 DIAGNOSIS — G43009 Migraine without aura, not intractable, without status migrainosus: Secondary | ICD-10-CM

## 2022-12-10 MED ORDER — SERTRALINE HCL 50 MG PO TABS: 50.0000 mg | ORAL_TABLET | Freq: Every day | ORAL | 4 refills | Status: DC

## 2022-12-10 NOTE — Patient Instructions (Signed)
Good to see you doing well. Continue Sertraline 50mg  daily. Follow-up in 1 year, call for any changes.

## 2022-12-10 NOTE — Progress Notes (Signed)
Virtual Visit via Video Note The purpose of this virtual visit is to provide medical care while limiting exposure to the novel coronavirus.    Consent was obtained for video visit:  Yes.   Answered questions that patient had about telehealth interaction:  Yes.   I discussed the limitations, risks, security and privacy concerns of performing an evaluation and management service by telemedicine. I also discussed with the patient that there may be a patient responsible charge related to this service. The patient expressed understanding and agreed to proceed.  Pt location: Home Physician Location: office Name of referring provider:  Doreene Burke, CNM I connected with Lavina Hamman Boyum at patients initiation/request on 12/10/2022 at  4:00 PM EDT by video enabled telemedicine application and verified that I am speaking with the correct person using two identifiers. Pt MRN:  [id number] Pt DOB:  2000/05/29 Video Participants:  Lavina Hamman Bittel   History of Present Illness:  The patient had a virtual video visit on 12/10/2022. She was last seen 4 months ago for migraines and dizziness. Since her last visit, she delivered a healthy baby boy [name] last June. She reports the migraines have gotten a lot better, she has them only once in a blue moon. She has dizziness every few days but they are not too bad. She denies any vision changes, focal numbness/tingling/weakness, no falls. Sleep is okay. She is not breastfeeding.    History on Initial Assessment 06/10/2016: This is an 22 yo RH woman who presented with worsening headaches and dizziness. She reports symptoms started intermittently at age 25, but have gotten worse the past 4 weeks, now occurring on a daily basis. She has throbbing headaches mostly over the right parietal region, but also over the frontal/vertex region, with associated nausea and dizziness. She describes the dizziness as either the room spinning or lightheadedness when she  walks. Initially they were occurring intermittently with good response to Ibuprofen, however she states that over the past 4 weeks, symptoms have been near-constant. She would have headaches on a daily basis with an intensity of 4 or 5 over 10. The dizziness comes and goes. She has difficulty stating the duration, they mostly worsen when she is doing activities, which has mad it difficult for her to do anything at school. She does report that her grades are fine. She does not take any medication now for these. There is no photo/phonophobia, vomiting, visual obscurations, tinnitus, focal numbness/tingling/weakness in her extremities, but she does have tingling on the right parietal region when it throbs. She has neck and back pain, no diplopia, dysarthria, bowel/bladder dysfunction. She has noticed some trouble swallowing the past 2 weeks. Her mother used to have migraines. She has not had any brain imaging in the past. She reports mood is "neutral." She reports good sleep except when she has headaches, appetite is good.    Current Outpatient Medications on File Prior to Visit  Medication Sig Dispense Refill   acetaminophen (TYLENOL) 325 MG tablet Take 2 tablets (650 mg total) by mouth every 4 (four) hours as needed (for pain scale < 4). 30 tablet 0   ibuprofen (ADVIL) 600 MG tablet Take 1 tablet (600 mg total) by mouth every 6 (six) hours. 30 tablet 0   sertraline (ZOLOFT) 50 MG tablet Take 1 tablet (50 mg total) by mouth daily. 90 tablet 3   No current facility-administered medications on file prior to visit.     Observations/Objective:   Vitals:   12/10/22 1444  Weight: 190 lb (86.2 kg)  Height: 5\' 1"  (1.549 m)   GEN:  The patient appears stated age and is in NAD.  Neurological examination: Patient is awake, alert. No aphasia or dysarthria. Intact fluency and comprehension. Cranial nerves: Extraocular movements intact with no nystagmus. No facial asymmetry. Motor: moves all extremities  symmetrically, at least anti-gravity x 4. No incoordination on finger to nose testing. Gait: narrow-based and steady, able to tandem walk adequately. Negative Romberg test.    Assessment and Plan:   This is a 22 yo RH woman with a history of headaches and dizziness since childhood suggestive of migraines without aura, who initially presented in 2018 with a change in headaches and dizziness that were occurring on a daily basis. MRI brain normal in 2018. She continues to have an excellent response to Sertraline 50mg  daily, migraines are once in a blue moon. Dizziness is not as bad. Refills sent. Follow-up in 1 year, call for any changes.    Follow Up Instructions:   -I discussed the assessment and treatment plan with the patient. The patient was provided an opportunity to ask questions and all were answered. The patient agreed with the plan and demonstrated an understanding of the instructions.   The patient was advised to call back or seek an in-person evaluation if the symptoms worsen or if the condition fails to improve as anticipated.    Van Clines, MD

## 2022-12-17 DIAGNOSIS — Z419 Encounter for procedure for purposes other than remedying health state, unspecified: Secondary | ICD-10-CM | POA: Diagnosis not present

## 2023-01-17 DIAGNOSIS — Z419 Encounter for procedure for purposes other than remedying health state, unspecified: Secondary | ICD-10-CM | POA: Diagnosis not present

## 2023-02-04 ENCOUNTER — Encounter: Payer: Self-pay | Admitting: Nurse Practitioner

## 2023-02-04 ENCOUNTER — Other Ambulatory Visit: Payer: Self-pay

## 2023-02-04 ENCOUNTER — Ambulatory Visit: Payer: Medicaid Other | Admitting: Nurse Practitioner

## 2023-02-04 VITALS — BP 110/78 | HR 90 | Temp 97.6°F | Resp 16 | Ht 61.0 in | Wt 190.0 lb

## 2023-02-04 DIAGNOSIS — Z130 Encounter for screening for diseases of the blood and blood-forming organs and certain disorders involving the immune mechanism: Secondary | ICD-10-CM | POA: Diagnosis not present

## 2023-02-04 DIAGNOSIS — E6609 Other obesity due to excess calories: Secondary | ICD-10-CM

## 2023-02-04 DIAGNOSIS — Z862 Personal history of diseases of the blood and blood-forming organs and certain disorders involving the immune mechanism: Secondary | ICD-10-CM | POA: Diagnosis not present

## 2023-02-04 DIAGNOSIS — Z6835 Body mass index (BMI) 35.0-35.9, adult: Secondary | ICD-10-CM

## 2023-02-04 DIAGNOSIS — Z131 Encounter for screening for diabetes mellitus: Secondary | ICD-10-CM | POA: Diagnosis not present

## 2023-02-04 DIAGNOSIS — Z13220 Encounter for screening for lipoid disorders: Secondary | ICD-10-CM

## 2023-02-04 DIAGNOSIS — E66812 Obesity, class 2: Secondary | ICD-10-CM | POA: Diagnosis not present

## 2023-02-04 DIAGNOSIS — Z0000 Encounter for general adult medical examination without abnormal findings: Secondary | ICD-10-CM | POA: Diagnosis not present

## 2023-02-04 DIAGNOSIS — Z7689 Persons encountering health services in other specified circumstances: Secondary | ICD-10-CM | POA: Diagnosis not present

## 2023-02-04 NOTE — Progress Notes (Signed)
Name: Miranda Prince   MRN: [id number]    DOB: 12-27-2000   Date:02/04/2023       Progress Note  Subjective  Chief Complaint  Chief Complaint  Patient presents with   Establish Care    HPI  Patient presents for annual CPE and establish care  Establish care: her last physical was 5 years ago.  Medical history includes anxiety,depression.  Family history includes breast cancer.  Health maintenance due for pap, scheduled for next week with GYN,  due for labs.   Diet: Regular Exercise: 3 days a week 10 minutes  Last Eye Exam:  Last Dental Exam: 2023  Flowsheet Row Office Visit from 02/04/2023 in [facility]  AUDIT-C Score 0      Depression: Phq 9 is  positive, currently on zoloft    02/04/2023   10:01 AM 08/18/2019    3:11 PM 05/20/2019   10:09 AM 03/31/2019    2:59 PM 12/24/2018   10:05 AM  Depression screen PHQ 2/9  Decreased Interest 0 0 0 0 0  Down, Depressed, Hopeless 0 0 0 0 0  PHQ - 2 Score 0 0 0 0 0  Altered sleeping 1 1 1 1  0  Tired, decreased energy 1 0 0 0 0  Change in appetite 2 1 0 0 0  Feeling bad or failure about yourself  0 0 0 0 0  Trouble concentrating 0 0 0 0 0  Moving slowly or fidgety/restless 0 0 0 0 0  Suicidal thoughts 0 0 0 0 0  PHQ-9 Score 4 2 1 1  0  Difficult doing work/chores  Not difficult at all Not difficult at all Not difficult at all    Hypertension: BP Readings from Last 3 Encounters:  02/04/23 110/78  10/31/22 98/71  09/07/22 109/70   Obesity: Wt Readings from Last 3 Encounters:  02/04/23 190 lb (86.2 kg)  12/10/22 190 lb (86.2 kg)  10/31/22 188 lb (85.3 kg)   BMI Readings from Last 3 Encounters:  02/04/23 35.90 kg/m  12/10/22 35.90 kg/m  10/31/22 35.52 kg/m     Vaccines:   HPV: 05/28/2018 Tdap: 06/06/2022 Pneumonia: 01/29/1999 Flu: refused COVID-19: refused   Hep C Screening: completed STD testing and prevention (HIV/chl/gon/syphilis): completed Intimate partner violence:  negative screen  Sexual History :sexually active, husband has vasectomy  Menstrual History/LMP/Abnormal Bleeding: [facility]: 01/29/2023 Discussed importance of follow up if any post-menopausal bleeding: not applicable  Incontinence Symptoms: positive for symptoms after 3rd child  Breast cancer:  - Last Mammogram: NA - BRCA gene screening: NA  Osteoporosis Prevention : Discussed high calcium and vitamin D supplementation, weight bearing exercises Bone density :not applicable   Cervical cancer screening: will have it done with the GYN  Skin cancer: Discussed monitoring for atypical lesions  Colorectal cancer: NA   Lung cancer:  Low Dose CT Chest recommended if Age 72-80 years, 20 pack-year currently smoking OR have quit w/in 15years. Patient does not qualify for screen   ECG: 10/30/2020  Advanced Care Planning: A voluntary discussion about advance care planning including the explanation and discussion of advance directives.  Discussed health care proxy and Living will, and the patient was able to identify a health care proxy as [place].  Patient does not have a living will and power of attorney of health care   Lipids: Lab Results  Component Value Date   CHOL 152 01/19/2020   Lab Results  Component Value Date   HDL 36 (L) 01/19/2020  Lab Results  Component Value Date   LDLCALC 94 01/19/2020   Lab Results  Component Value Date   TRIG 120 01/19/2020   Lab Results  Component Value Date   CHOLHDL 4.2 01/19/2020   No results found for: "LDLDIRECT"  Glucose: Glucose, Bld  Date Value Ref Range Status  09/07/2022 82 70 - 99 mg/dL Final    Comment:    Glucose reference range applies only to samples taken after fasting for at least 8 hours.  06/06/2021 93 70 - 99 mg/dL Final    Comment:    Glucose reference range applies only to samples taken after fasting for at least 8 hours.  10/28/2020 98 70 - 99 mg/dL Final    Comment:    Glucose reference range applies only to  samples taken after fasting for at least 8 hours.    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  Family History  Problem Relation Age of Onset   Diabetes Mother    Seizures Maternal Grandmother    Stroke Paternal Grandfather    Diabetes Maternal Uncle    Depression Paternal Aunt    Asthma Paternal Aunt    COPD Paternal Aunt    Anxiety disorder Paternal Aunt    Diabetes Other    Cancer Other     Social History   Socioeconomic History   Marital status: Married    Spouse name: [place]   Number of children: 3   Years of education: 12   Highest education level: 12th grade  Occupational History   Occupation: Lexicographer houses c a IT consultant  Tobacco Use   Smoking status: Never    Passive exposure: Never   Smokeless tobacco: Never  Vaping Use   Vaping status: Former  Substance and Sexual Activity   Alcohol use: No   Drug use: No   Sexual activity: Yes    Partners: Male    Birth control/protection: None  Other Topics Concern   Not on file  Social History Narrative   Right handed    Lives with family    Just had a baby has 3 children   Social Determinants of Health   Financial Resource Strain: Medium Risk (02/04/2023)   Overall Financial Resource Strain (CARDIA)    Difficulty of Paying Living Expenses: Somewhat hard  Food Insecurity: No Food Insecurity (02/04/2023)   Hunger Vital Sign    Worried About Running Out of Food in the Last Year: Never true    Ran Out of Food in the Last Year: Never true  Recent Concern: Food Insecurity - Food Insecurity Present (02/03/2023)   Hunger Vital Sign    Worried About Running Out of Food in the Last Year: Sometimes true    Ran Out of Food in the Last Year: Never true  Transportation Needs: No Transportation Needs (02/04/2023)   PRAPARE -  Administrator, Civil Service (Medical): No    Lack of Transportation (Non-Medical): No  Physical Activity: Insufficiently Active (02/04/2023)   Exercise Vital Sign    Days of Exercise per Week: 3 days    Minutes of Exercise per Session: 10 min  Stress: No Stress Concern Present (02/04/2023)   [employer] of Occupational Health - Occupational Stress Questionnaire    Feeling of Stress : Only a little  Social Connections: Moderately Integrated (02/04/2023)   Social Connection and Isolation Panel [NHANES]    Frequency of Communication with Friends and Family: More than three times a week    Frequency of Social Gatherings with Friends and Family: More than three times a week    Attends Religious Services: More than 4 times per year    Active Member of [employer] or Organizations: No    Attends Banker Meetings: Never    Marital Status: Married  Catering manager Violence: Not At Risk (02/04/2023)   Humiliation, Afraid, Rape, and Kick questionnaire    Fear of Current or Ex-Partner: No    Emotionally Abused: No    Physically Abused: No    Sexually Abused: No     Current Outpatient Medications:    sertraline (ZOLOFT) 50 MG tablet, Take 1 tablet (50 mg total) by mouth daily., Disp: 90 tablet, Rfl: 4  No Known Allergies   ROS  Constitutional: Negative for fever or weight change.  Respiratory: Negative for cough and shortness of breath.   Cardiovascular: Negative for chest pain or palpitations.  Gastrointestinal: Negative for abdominal pain, no bowel changes.  Musculoskeletal: Negative for gait problem or joint swelling.  Skin: Negative for rash.  Neurological: Negative for dizziness or headache.  No other specific complaints in a complete review of systems (except as listed in HPI above).   Objective  Vitals:   02/04/23 1002  BP: 110/78  Pulse: 90  Resp: 16  Temp: 97.6 F (36.4 C)  TempSrc: Oral  SpO2: 98%  Weight: 190 lb (86.2 kg)  Height: 5\' 1"   (1.549 m)    Body mass index is 35.9 kg/m.  Physical Exam Constitutional: Patient appears well-developed and well-nourished. No distress.  HENT: Head: Normocephalic and atraumatic. Ears: B TMs ok, no erythema or effusion; Nose: Nose normal. Mouth/Throat: Oropharynx is clear and moist. No oropharyngeal exudate.  Eyes: Conjunctivae and EOM are normal. Pupils are equal, round, and reactive to light. No scleral icterus.  Neck: Normal range of motion. Neck supple. No JVD present. No thyromegaly present.  Cardiovascular: Normal rate, regular rhythm and normal heart sounds.  No murmur heard. No BLE edema. Pulmonary/Chest: Effort normal and breath sounds normal. No respiratory distress. Abdominal: Soft. Bowel sounds are normal, no distension. There is no tenderness. no masses Musculoskeletal: Normal range of motion, no joint effusions. No gross deformities Neurological: he is alert and oriented to person, place, and time. No cranial nerve deficit. Coordination, balance, strength, speech and gait are normal.  Skin: Skin is warm and dry. No rash noted. No erythema.  Psychiatric: Patient has a normal mood and affect. behavior is normal. Judgment and thought content normal.   No results found for this or any previous visit (from the past 2160 hour(s)).   Fall Risk:    02/04/2023   10:00 AM 12/10/2022    2:45 PM 08/13/2022    2:19 PM 09/10/2021   11:11 AM 01/21/2020    2:33 PM  Fall Risk   Falls in the past year? 0 0 0 0 0  Number falls in past yr: 0 0 0 0 0  Injury with Fall? 0 0 0 0 0  Risk for fall due to : No Fall Risks      Follow up Falls prevention discussed Falls evaluation completed Falls evaluation completed       Functional Status Survey: Is the patient deaf or have difficulty hearing?: No Does the patient have difficulty seeing, even when wearing glasses/contacts?:  No Does the patient have difficulty concentrating, remembering, or making decisions?: No Does the patient have  difficulty walking or climbing stairs?: No Does the patient have difficulty dressing or bathing?: No   Assessment & Plan  1. Annual physical exam  - TSH - CBC with Differential/Platelet - COMPLETE METABOLIC PANEL WITH GFR - Lipid panel - Hemoglobin A1c  2. Encounter to establish care Cpe completed  3. Screening for diabetes mellitus  - COMPLETE METABOLIC PANEL WITH GFR - Hemoglobin A1c  4. Screening for deficiency anemia  - CBC with Differential/Platelet  5. Screening for cholesterol level  - Lipid panel  6. History of anemia  - CBC with Differential/Platelet  7.  Class 2 obesity due to excess calories without serious comorbidity with body mass index (BMI) of 35.0 to 35.9 in adult  - TSH    -USPSTF grade A and B recommendations reviewed with patient; age-appropriate recommendations, preventive care, screening tests, etc discussed and encouraged; healthy living encouraged; see AVS for patient education given to patient -Discussed importance of 150 minutes of physical activity weekly, eat two servings of fish weekly, eat one serving of tree nuts ( cashews, pistachios, pecans, almonds.Marland Kitchen) every other day, eat 6 servings of fruit/vegetables daily and drink plenty of water and avoid sweet beverages.   -Reviewed Health Maintenance: Yes.

## 2023-02-05 LAB — CBC WITH DIFFERENTIAL/PLATELET
Absolute Lymphocytes: 1822 {cells}/uL (ref 850–3900)
Absolute Monocytes: 408 {cells}/uL (ref 200–950)
Basophils Absolute: 68 {cells}/uL (ref 0–200)
Basophils Relative: 1 %
Eosinophils Absolute: 347 {cells}/uL (ref 15–500)
Eosinophils Relative: 5.1 %
HCT: 39.7 % (ref 35.0–45.0)
Hemoglobin: 12.9 g/dL (ref 11.7–15.5)
MCH: 26.2 pg — ABNORMAL LOW (ref 27.0–33.0)
MCHC: 32.5 g/dL (ref 32.0–36.0)
MCV: 80.7 fL (ref 80.0–100.0)
MPV: 10.5 fL (ref 7.5–12.5)
Monocytes Relative: 6 %
Neutro Abs: 4155 {cells}/uL (ref 1500–7800)
Neutrophils Relative %: 61.1 %
Platelets: 394 10*3/uL (ref 140–400)
RBC: 4.92 10*6/uL (ref 3.80–5.10)
RDW: 14.2 % (ref 11.0–15.0)
Total Lymphocyte: 26.8 %
WBC: 6.8 10*3/uL (ref 3.8–10.8)

## 2023-02-05 LAB — COMPLETE METABOLIC PANEL WITH GFR
AG Ratio: 1.4 (calc) (ref 1.0–2.5)
ALT: 22 U/L (ref 6–29)
AST: 22 U/L (ref 10–30)
Albumin: 4.6 g/dL (ref 3.6–5.1)
Alkaline phosphatase (APISO): 75 U/L (ref 31–125)
BUN: 13 mg/dL (ref 7–25)
CO2: 29 mmol/L (ref 20–32)
Calcium: 9.7 mg/dL (ref 8.6–10.2)
Chloride: 102 mmol/L (ref 98–110)
Creat: 0.65 mg/dL (ref 0.50–0.96)
Globulin: 3.4 g/dL (ref 1.9–3.7)
Glucose, Bld: 100 mg/dL — ABNORMAL HIGH (ref 65–99)
Potassium: 3.9 mmol/L (ref 3.5–5.3)
Sodium: 138 mmol/L (ref 135–146)
Total Bilirubin: 0.6 mg/dL (ref 0.2–1.2)
Total Protein: 8 g/dL (ref 6.1–8.1)
eGFR: 126 mL/min/{1.73_m2} (ref >=60)

## 2023-02-05 LAB — HEMOGLOBIN A1C
Hgb A1c MFr Bld: 5.9 %{Hb} — ABNORMAL HIGH (ref <5.7)
Mean Plasma Glucose: 123 mg/dL
eAG (mmol/L): 6.8 mmol/L

## 2023-02-05 LAB — LIPID PANEL
Cholesterol: 191 mg/dL (ref <200)
HDL: 42 mg/dL — ABNORMAL LOW (ref >=50)
LDL Cholesterol (Calc): 124 mg/dL — ABNORMAL HIGH
Non-HDL Cholesterol (Calc): 149 mg/dL — ABNORMAL HIGH (ref <130)
Total CHOL/HDL Ratio: 4.5 (calc) (ref <5.0)
Triglycerides: 135 mg/dL (ref <150)

## 2023-02-05 LAB — TSH: TSH: 0.85 m[IU]/L

## 2023-02-07 ENCOUNTER — Ambulatory Visit (INDEPENDENT_AMBULATORY_CARE_PROVIDER_SITE_OTHER): Payer: Medicaid Other | Admitting: Certified Nurse Midwife

## 2023-02-07 ENCOUNTER — Encounter: Payer: Self-pay | Admitting: Certified Nurse Midwife

## 2023-02-07 ENCOUNTER — Other Ambulatory Visit (HOSPITAL_COMMUNITY)
Admission: RE | Admit: 2023-02-07 | Discharge: 2023-02-07 | Disposition: A | Discharge status: Home / Self Care | Payer: Medicaid Other | Source: Ambulatory Visit | Attending: Certified Nurse Midwife | Admitting: Certified Nurse Midwife

## 2023-02-07 VITALS — BP 108/71 | HR 85 | Ht 61.0 in | Wt 190.9 lb

## 2023-02-07 DIAGNOSIS — N946 Dysmenorrhea, unspecified: Secondary | ICD-10-CM

## 2023-02-07 DIAGNOSIS — R7303 Prediabetes: Secondary | ICD-10-CM

## 2023-02-07 DIAGNOSIS — Z01419 Encounter for gynecological examination (general) (routine) without abnormal findings: Secondary | ICD-10-CM | POA: Insufficient documentation

## 2023-02-07 DIAGNOSIS — Z113 Encounter for screening for infections with a predominantly sexual mode of transmission: Secondary | ICD-10-CM | POA: Diagnosis not present

## 2023-02-07 DIAGNOSIS — Z6836 Body mass index (BMI) 36.0-36.9, adult: Secondary | ICD-10-CM

## 2023-02-07 DIAGNOSIS — K5909 Other constipation: Secondary | ICD-10-CM

## 2023-02-07 DIAGNOSIS — Z124 Encounter for screening for malignant neoplasm of cervix: Secondary | ICD-10-CM | POA: Diagnosis not present

## 2023-02-07 MED ORDER — IBUPROFEN 800 MG PO TABS: 800.0000 mg | ORAL_TABLET | Freq: Three times a day (TID) | ORAL | 0 refills | Status: AC | PRN

## 2023-02-07 NOTE — Patient Instructions (Signed)

## 2023-02-07 NOTE — Progress Notes (Signed)
ANNUAL EXAM Patient name: Miranda Prince MRN [id number]  Date of birth: 03-Dec-2000 Chief Complaint:   Gynecologic Exam  History of Present Illness:   Miranda Prince is a 22 y.o. [phone] Caucasian female being seen today for a routine annual exam.  Current complaints: constipation at times, painful periods. HgbA1c was 5.9 recently, has started working out after work, usually a 59m workout on phone app, unsure of how to make better dietary choices. 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  Patient's last menstrual period was 01/29/2023.   Upstream - 02/07/23 1352       Pregnancy Intention Screening   Does the patient want to become pregnant in the next year? No    Does the patient's partner want to become pregnant in the next year? No    Would the patient like to discuss contraceptive options today? No      Contraception Wrap Up   Current Method Vasectomy    End Method Vasectomy    Contraception Counseling Provided No    How was the end contraceptive method provided? N/A            The pregnancy intention screening data noted above was reviewed. Potential methods of contraception were discussed. The patient elected to proceed with Vasectomy.      Component Value Date/Time   DIAGPAP  01/19/2020 1509    - Negative for intraepithelial lesion or malignancy (NILM)   ADEQPAP  01/19/2020 1509    Satisfactory for evaluation; transformation zone component PRESENT.       Last pap 2021. Results were: NILM w/ HRHPV not done. H/O abnormal pap: no Last mammogram: n/a. Results were: N/A. Family h/o breast cancer: yes maternal great grandmother, maternal aunt unsure of age Last colonoscopy: n/a. Results were: N/A. Family h/o colorectal cancer: no     02/04/2023   10:01 AM 08/18/2019    3:11 PM 05/20/2019   10:09 AM 03/31/2019    2:59 PM 12/24/2018   10:05 AM  Depression screen PHQ 2/9  Decreased Interest 0 0 0 0 0  Down, Depressed, Hopeless 0 0 0 0 0  PHQ  - 2 Score 0 0 0 0 0  Altered sleeping 1 1 1 1  0  Tired, decreased energy 1 0 0 0 0  Change in appetite 2 1 0 0 0  Feeling bad or failure about yourself  0 0 0 0 0  Trouble concentrating 0 0 0 0 0  Moving slowly or fidgety/restless 0 0 0 0 0  Suicidal thoughts 0 0 0 0 0  PHQ-9 Score 4 2 1 1  0  Difficult doing work/chores  Not difficult at all Not difficult at all Not difficult at all         02/04/2023   10:02 AM 03/31/2019    3:01 PM  GAD 7 : Generalized Anxiety Score  Nervous, Anxious, on Edge 0 0  Control/stop worrying 0 0  Worry too much - different things 0 0  Trouble relaxing 0 0  Restless 0 0  Easily annoyed or irritable 0 0  Afraid - awful might happen 0 0  Total GAD 7 Score 0 0  Anxiety Difficulty Not difficult at all      Review of Systems:   Pertinent items are noted in HPI Denies any headaches, blurred vision, fatigue, shortness of breath, chest pain, abdominal pain, abnormal vaginal discharge/itching/odor/irritation, problems with periods, urination, or intercourse unless otherwise stated above. Pertinent History Reviewed:  Reviewed past medical,surgical, social and family history.  Reviewed problem list, medications and allergies. Physical Assessment:   Vitals:   02/07/23 1348  BP: 108/71  Pulse: 85  Weight: 190 lb 14.4 oz (86.6 kg)  Height: 5\' 1"  (1.549 m)  Body mass index is 36.07 kg/m.       Physical Exam Vitals reviewed.  Constitutional:      Appearance: Normal appearance.  HENT:     Head: Normocephalic.  Neck:     Thyroid: No thyroid mass or thyromegaly.  Cardiovascular:     Rate and Rhythm: Normal rate and regular rhythm.     Heart sounds: Normal heart sounds.  Pulmonary:     Effort: Pulmonary effort is normal.     Breath sounds: Normal breath sounds.  Chest:  Breasts:    Tanner Score is 5.     Right: Normal.     Left: Normal.  Abdominal:     General: Abdomen is flat.     Palpations: Abdomen is soft.     Tenderness: There is no  abdominal tenderness.  Genitourinary:    General: Normal vulva.     Vagina: Normal.     Cervix: Normal.     Uterus: Normal.      Adnexa:        Right: No mass or tenderness.         Left: No mass or tenderness.       Comments: Pap collected. Musculoskeletal:     Cervical back: Neck supple. No tenderness.  Skin:    General: Skin is warm and dry.  Neurological:     General: No focal deficit present.     Mental Status: She is alert and oriented to person, place, and time.  Psychiatric:        Mood and Affect: Mood normal.        Behavior: Behavior normal.      No results found for this or any previous visit (from the past 24 hour(s)).  Assessment & Plan:  1. Well woman exam with routine gynecological exam - Cytology - PAP  2. Cervical cancer screening - Cytology - PAP  3. Screening for STD (sexually transmitted disease) - Cytology - PAP  4. Dysmenorrhea  5. Pre-diabetes - Referral to Nutrition and Diabetes Services  6. BMI 36.0-36.9,adult - Referral to Nutrition and Diabetes Services  7. Intermittent constipation Comfort measures for relief of constipation including miralax daily until stools regular and easy to pass, docusate as stool softener, encouraged increased hydration.  Offered referral for nutrition education due to pre-diabetes and uncertainty of how to make better dietary choices and this was accepted.  Mammogram: @ 24yo, or sooner if problems Colonoscopy: @ 24yo, or sooner if problems  Orders Placed This Encounter  Procedures   Referral to Nutrition and Diabetes Services    Meds:  Meds ordered this encounter  Medications   ibuprofen (ADVIL) 800 MG tablet    Sig: Take 1 tablet (800 mg total) by mouth every 8 (eight) hours as needed. Take 1 tablet (800mg ) every 8 hours starting the day before menstrual cycle starts and continue for first two days of cycle to reduce dysmennorhea    Dispense:  30 tablet    Refill:  0    Order Specific Question:    Supervising Provider    Answer:   Hildred Laser [[id number]]    Follow-up: Return in 1 year (on 02/07/2024) for Annual exam.  Dominica Severin, CNM 02/07/2023 2:31 PM

## 2023-02-12 LAB — CYTOLOGY - PAP
Chlamydia: NEGATIVE
Comment: NEGATIVE
Comment: NORMAL
Diagnosis: NEGATIVE
Neisseria Gonorrhea: NEGATIVE

## 2023-02-16 DIAGNOSIS — Z419 Encounter for procedure for purposes other than remedying health state, unspecified: Secondary | ICD-10-CM | POA: Diagnosis not present

## 2023-03-19 DIAGNOSIS — Z419 Encounter for procedure for purposes other than remedying health state, unspecified: Secondary | ICD-10-CM | POA: Diagnosis not present

## 2023-04-02 ENCOUNTER — Encounter: Payer: Self-pay | Admitting: Dietician

## 2023-04-02 ENCOUNTER — Encounter: Payer: Medicaid Other | Attending: Certified Nurse Midwife | Admitting: Dietician

## 2023-04-02 VITALS — Ht 61.0 in | Wt 185.9 lb

## 2023-04-02 DIAGNOSIS — R7303 Prediabetes: Secondary | ICD-10-CM | POA: Diagnosis not present

## 2023-04-02 DIAGNOSIS — Z713 Dietary counseling and surveillance: Secondary | ICD-10-CM | POA: Diagnosis not present

## 2023-04-02 DIAGNOSIS — Z6836 Body mass index (BMI) 36.0-36.9, adult: Secondary | ICD-10-CM | POA: Diagnosis not present

## 2023-04-02 DIAGNOSIS — E66812 Obesity, class 2: Secondary | ICD-10-CM

## 2023-04-02 NOTE — Patient Instructions (Signed)
 Keep food portions controlled by reducing amounts of starchy foods, while including and/or increasing portions of veggies.  Aim for 1 cup or less (fist-size) of starches, palm size (average of 3oz cooked) meats.  Continue to limit sugar sweetened drinks and sweet foods, great job! Continue to cook foods with small amounts of healthy oils, use skillet, oven, crock pot, or air fryer.  Stay active at home with exercise videos, playing with children, etc.

## 2023-04-02 NOTE — Progress Notes (Signed)
 Medical Nutrition Therapy: Visit start time: 0930  end time: 1030  Assessment:   Referral Diagnosis: prediabetes, obesity Other medical history/ diagnoses: none signficiant Psychosocial issues/ stress concerns: none  Medications, supplements: reconciled list in medical record   Current weight: 185.9lbs Height: 5'1" BMI: 35.3   Progress and evaluation:  Patient reports she has been decreasing sugary drinks and foods and fried foods. Has 12-month old infant and another child in the home.  No history of gestational diabetes per patient, but does have family history of type 2 DM.  Recent labs:  02/07/23 HbA1C 5.9%; total cholesterol 191, HDL 42, LDL 124, triglycerides 135 Food allergies: none Special diet practices: none Patient seeks help with healthy food choices, healthy cooking methods   Dietary Intake:  Usual eating pattern includes 3 meals and 3 snacks per day. Dining out frequency: 0-1 meals per week. Who plans meals/ buys groceries? self Who prepares meals? self  Breakfast: usually at work or on the way -- breakfast corn dog; muffin; granola bar + water Snack: same as evening Lunch: leftovers; takeout rarely Snack: same as evening Supper: baked chicken with veg ie broccoli, green beans, corn, other; limits ground beef or chooses extra lean Snack: potato chips; goldfish crackers Beverages: mostly water; occasional soda, trying to decrease/ eliminate  Physical activity: sporadic exercise videos; has housecleaning job   Intervention:   Nutrition Care Education:   Basic nutrition: basic food groups; appropriate nutrient balance; appropriate meal and snack schedule; general nutrition guidelines    Weight control: importance of low sugar and low fat choices; portion control strategies; estimated energy needs for weight loss at 1600 kcal, provided guidance for 45% CHO, appropriate protein portions, limited added fats Advanced nutrition:  recipe modification; cooking  techniques prediabetes:  goal for HbA1C; appropriate meal and snack schedule; appropriate carb intake and balance, healthy carb choices; role of fiber, protein, fat; physical activity; effects of stress Hyperlipidemia:  target goals for lipids; healthy and unhealthy fats; role of fiber, plant sterols; role of exercise   Other intervention notes: Patient has been making appropriate positive changes, and is motivated to continue.  Established additional goals with direction from patient.   Nutritional Diagnosis:  [place]-2.1 Inpaired nutrition utilization and [place]-2.2 Altered nutrition-related laboratory As related to prediabetes, hyperlipidemia.  As evidenced by elevated HbA1C, elevated LDL, low HDL. [place]-3.3 Overweight/obesity As related to history of excess calories.  As evidenced by patient with current BMI of 35.   Education Materials given:  General diet guidelines for Diabetes (prevention) Plate Planner with food lists, sample meal pattern Sample menus Skillet meal template Visit summary with goals/ instructions   Learner/ who was taught:  Patient   Level of understanding: Verbalizes/ demonstrates competency  Demonstrated degree of understanding via:   Teach back Learning barriers: None  Willingness to learn/ readiness for change: Eager, change in progress  Monitoring and Evaluation:  Dietary intake, exercise, pertinent labs, and body weight      follow up:  05/26/23 at 9:30am

## 2023-04-19 DIAGNOSIS — Z419 Encounter for procedure for purposes other than remedying health state, unspecified: Secondary | ICD-10-CM | POA: Diagnosis not present

## 2023-05-17 DIAGNOSIS — R350 Frequency of micturition: Secondary | ICD-10-CM | POA: Diagnosis not present

## 2023-05-17 DIAGNOSIS — Z419 Encounter for procedure for purposes other than remedying health state, unspecified: Secondary | ICD-10-CM | POA: Diagnosis not present

## 2023-05-17 DIAGNOSIS — R1011 Right upper quadrant pain: Secondary | ICD-10-CM | POA: Diagnosis not present

## 2023-05-17 DIAGNOSIS — Z3202 Encounter for pregnancy test, result negative: Secondary | ICD-10-CM | POA: Diagnosis not present

## 2023-05-17 DIAGNOSIS — R8290 Unspecified abnormal findings in urine: Secondary | ICD-10-CM | POA: Diagnosis not present

## 2023-05-26 ENCOUNTER — Ambulatory Visit: Payer: Medicaid Other | Admitting: Dietician

## 2023-06-22 DIAGNOSIS — J069 Acute upper respiratory infection, unspecified: Secondary | ICD-10-CM | POA: Diagnosis not present

## 2023-06-22 DIAGNOSIS — R051 Acute cough: Secondary | ICD-10-CM | POA: Diagnosis not present

## 2023-06-22 DIAGNOSIS — R0981 Nasal congestion: Secondary | ICD-10-CM | POA: Diagnosis not present

## 2023-06-25 ENCOUNTER — Encounter: Payer: Self-pay | Admitting: Dietician

## 2023-06-25 NOTE — Progress Notes (Signed)
 Have not heard back from patient to reschedule her missed appointment from 05/26/23. Sent notification to referring provider.

## 2023-06-28 DIAGNOSIS — Z419 Encounter for procedure for purposes other than remedying health state, unspecified: Secondary | ICD-10-CM | POA: Diagnosis not present

## 2023-07-28 DIAGNOSIS — Z419 Encounter for procedure for purposes other than remedying health state, unspecified: Secondary | ICD-10-CM | POA: Diagnosis not present

## 2023-08-28 DIAGNOSIS — Z419 Encounter for procedure for purposes other than remedying health state, unspecified: Secondary | ICD-10-CM | POA: Diagnosis not present

## 2023-08-29 DIAGNOSIS — J029 Acute pharyngitis, unspecified: Secondary | ICD-10-CM | POA: Diagnosis not present

## 2023-09-27 DIAGNOSIS — Z419 Encounter for procedure for purposes other than remedying health state, unspecified: Secondary | ICD-10-CM | POA: Diagnosis not present

## 2023-10-03 ENCOUNTER — Encounter: Payer: Self-pay | Admitting: Advanced Practice Midwife

## 2023-10-17 DIAGNOSIS — R5381 Other malaise: Secondary | ICD-10-CM | POA: Diagnosis not present

## 2023-10-17 DIAGNOSIS — R5383 Other fatigue: Secondary | ICD-10-CM | POA: Diagnosis not present

## 2023-10-17 DIAGNOSIS — Z3202 Encounter for pregnancy test, result negative: Secondary | ICD-10-CM | POA: Diagnosis not present

## 2023-10-24 ENCOUNTER — Ambulatory Visit (INDEPENDENT_AMBULATORY_CARE_PROVIDER_SITE_OTHER): Admitting: Nurse Practitioner

## 2023-10-24 VITALS — BP 106/72 | HR 88 | Resp 16 | Ht 61.0 in | Wt 188.0 lb

## 2023-10-24 DIAGNOSIS — R0602 Shortness of breath: Secondary | ICD-10-CM

## 2023-10-24 DIAGNOSIS — R5383 Other fatigue: Secondary | ICD-10-CM

## 2023-10-24 DIAGNOSIS — R040 Epistaxis: Secondary | ICD-10-CM

## 2023-10-24 NOTE — Progress Notes (Signed)
 BP 106/72   Pulse 88   Resp 16   Ht 5' 1 (1.549 m)   Wt 188 lb (85.3 kg)   LMP 10/19/2023   SpO2 94%   Breastfeeding No   BMI 35.52 kg/m    Subjective:    Patient ID: Miranda Prince, female    DOB: 2000-08-15, 23 y.o.   MRN: [id number]  HPI: Miranda Prince is a 23 y.o. female  Chief Complaint  Patient presents with   Epistaxis    Had an episode a week and half ago   Shortness of Breath    Mainly active, sx a week ago, [facility] was tested for covid/flu-Negative, regards the nosebleed may be related to past history anemia as what she was advised at [facility].   Fatigue    O going for 3 weeks, the past 2 days a little better    Discussed the use of AI scribe software for clinical note transcription with the patient, who gave verbal consent to proceed.  History of Present Illness Miranda Prince is a 23 year old female who presents with shortness of breath, fatigue, and epistaxis.  Dyspnea - Shortness of breath, particularly noticeable after physical activity such as walking - Mild shortness of breath when lying down - No severe dyspnea at rest  Fatigue - Significant fatigue for the past three weeks - Slight improvement in fatigue over the last two days - Started taking iron  pills after visiting [facility] last week, with some improvement in symptoms  Epistaxis - Two episodes of nosebleeds in the past month - One episode was severe and occurred while on steroids - One episode occurred during exposure to extreme heat at a birthday party  Recent infectious disease testing - Tested for COVID-19 and influenza at urgent care last week - Both COVID-19 and influenza tests were negative         10/24/2023    8:49 AM 04/02/2023    9:38 AM 02/04/2023   10:01 AM  Depression screen PHQ 2/9  Decreased Interest 0 0 0  Down, Depressed, Hopeless 0 0 0  PHQ - 2 Score 0 0 0  Altered sleeping   1  Tired, decreased energy   1  Change in  appetite   2  Feeling bad or failure about yourself    0  Trouble concentrating   0  Moving slowly or fidgety/restless   0  Suicidal thoughts   0  PHQ-9 Score   4    Relevant past medical, surgical, family and social history reviewed and updated as indicated. Interim medical history since our last visit reviewed. Allergies and medications reviewed and updated.  Review of Systems  Ten systems reviewed and is negative except as mentioned in HPI      Objective:     BP 106/72   Pulse 88   Resp 16   Ht 5' 1 (1.549 m)   Wt 188 lb (85.3 kg)   LMP 10/19/2023   SpO2 94%   Breastfeeding No   BMI 35.52 kg/m    Wt Readings from Last 3 Encounters:  10/24/23 188 lb (85.3 kg)  04/02/23 185 lb 14.4 oz (84.3 kg)  02/07/23 190 lb 14.4 oz (86.6 kg)    Physical Exam Physical Exam VITALS: SaO2- 94% GENERAL: Alert, cooperative, well developed, no acute distress HEENT: Normocephalic, normal oropharynx, moist mucous membranes CHEST: Clear to auscultation bilaterally, no wheezes, rhonchi, or crackles, oxygen saturation 98-99%  on ambulation CARDIOVASCULAR: Normal heart rate and rhythm, S1 and S2 normal without murmurs ABDOMEN: Soft, non-tender, non-distended, without organomegaly, normal bowel sounds EXTREMITIES: No cyanosis or edema NEUROLOGICAL: Cranial nerves grossly intact, moves all extremities without gross motor or sensory deficit   Results for orders placed or performed in visit on 02/07/23  Cytology - PAP   Collection Time: 02/07/23  1:47 PM  Result Value Ref Range   Neisseria Gonorrhea Negative    Chlamydia Negative    Adequacy      Satisfactory for evaluation; transformation zone component PRESENT.   Diagnosis      - Negative for intraepithelial lesion or malignancy (NILM)   Comment Normal Reference Ranger Chlamydia - Negative    Comment      Normal Reference Range Neisseria Gonorrhea - Negative          Assessment & Plan:   Problem List Items Addressed This Visit    None Visit Diagnoses       Shortness of breath    -  Primary   Relevant Orders   CBC with Differential/Platelet   Comprehensive metabolic panel with GFR   TSH   VITAMIN D 25 Hydroxy (Vit-D Deficiency, Fractures)   VON [facility] COMPREHENSIVE PANEL   Protime-INR   APTT   Iron , TIBC and Ferritin Panel   B12 and Folate Panel     Epistaxis       Relevant Orders   CBC with Differential/Platelet   Comprehensive metabolic panel with GFR   TSH   VITAMIN D 25 Hydroxy (Vit-D Deficiency, Fractures)   VON [facility] COMPREHENSIVE PANEL   Protime-INR   APTT   Iron , TIBC and Ferritin Panel   B12 and Folate Panel     Other fatigue       Relevant Orders   CBC with Differential/Platelet   Comprehensive metabolic panel with GFR   TSH   VITAMIN D 25 Hydroxy (Vit-D Deficiency, Fractures)   VON [facility] COMPREHENSIVE PANEL   Protime-INR   APTT   Iron , TIBC and Ferritin Panel   B12 and Folate Panel        Assessment and Plan Assessment & Plan Fatigue, shortness of breath, and epistaxis with evaluation for suspected anemia and coagulation disorder Fatigue, shortness of breath, and epistaxis have persisted for three weeks. Fatigue has slightly improved with iron  supplementation. Shortness of breath is mild and exertional. Epistaxis occurred twice last month, with one severe episode. Differential diagnosis includes anemia and coagulation disorder. Recent tests for COVID-19 and influenza were negative. No chest pain or significant bruising reported. Oxygen saturation was 94% on arrival, improving to 98-99% during walking pulse oximetry, indicating adequate oxygenation. Clear lung auscultation reduces the likelihood of a pulmonary cause. - Order CBC, CMP, TSH, vitamin D, B12, and iron  panel to evaluate for anemia. - Order coagulation workup to assess for clotting disorder. - Perform walking pulse oximetry to assess oxygen saturation during exertion. - Reassure that no immediate chest imaging  is necessary based on current findings. - Review lab results over the weekend and contact if urgent issues arise; otherwise, discuss results on Monday.        Follow up plan: Return if symptoms worsen or fail to improve.

## 2023-10-27 ENCOUNTER — Ambulatory Visit: Payer: Self-pay | Admitting: Nurse Practitioner

## 2023-10-27 DIAGNOSIS — E611 Iron deficiency: Secondary | ICD-10-CM

## 2023-10-27 MED ORDER — IRON (FERROUS SULFATE) 325 (65 FE) MG PO TABS
325.0000 mg | ORAL_TABLET | Freq: Every day | ORAL | 1 refills | Status: AC
Start: 2023-10-27 — End: ?

## 2023-10-28 DIAGNOSIS — Z419 Encounter for procedure for purposes other than remedying health state, unspecified: Secondary | ICD-10-CM | POA: Diagnosis not present

## 2023-10-29 LAB — CBC WITH DIFFERENTIAL/PLATELET
Absolute Lymphocytes: 1990 {cells}/uL (ref 850–3900)
Absolute Monocytes: 503 {cells}/uL (ref 200–950)
Basophils Absolute: 60 {cells}/uL (ref 0–200)
Basophils Relative: 0.9 %
Eosinophils Absolute: 268 {cells}/uL (ref 15–500)
Eosinophils Relative: 4 %
HCT: 38 % (ref 35.0–45.0)
Hemoglobin: 11.9 g/dL (ref 11.7–15.5)
MCH: 26.7 pg — ABNORMAL LOW (ref 27.0–33.0)
MCHC: 31.3 g/dL — ABNORMAL LOW (ref 32.0–36.0)
MCV: 85.4 fL (ref 80.0–100.0)
MPV: 10.6 fL (ref 7.5–12.5)
Monocytes Relative: 7.5 %
Neutro Abs: 3879 {cells}/uL (ref 1500–7800)
Neutrophils Relative %: 57.9 %
Platelets: 361 Thousand/uL (ref 140–400)
RBC: 4.45 Million/uL (ref 3.80–5.10)
RDW: 13 % (ref 11.0–15.0)
Total Lymphocyte: 29.7 %
WBC: 6.7 Thousand/uL (ref 3.8–10.8)

## 2023-10-29 LAB — VON WILLEBRAND COMPREHENSIVE PANEL
Factor-VIII Activity: 78 %{normal} (ref 50–180)
Ristocetin Co-Factor: 46 %{normal} (ref 42–200)
Von Willebrand Antigen, Plasma: 77 % (ref 50–217)
aPTT: 30 s (ref 23–32)

## 2023-10-29 LAB — COMPREHENSIVE METABOLIC PANEL WITH GFR
AG Ratio: 1.4 (calc) (ref 1.0–2.5)
ALT: 23 U/L (ref 6–29)
AST: 18 U/L (ref 10–30)
Albumin: 4.6 g/dL (ref 3.6–5.1)
Alkaline phosphatase (APISO): 75 U/L (ref 31–125)
BUN: 11 mg/dL (ref 7–25)
CO2: 26 mmol/L (ref 20–32)
Calcium: 9.5 mg/dL (ref 8.6–10.2)
Chloride: 102 mmol/L (ref 98–110)
Creat: 0.58 mg/dL (ref 0.50–0.96)
Globulin: 3.4 g/dL (ref 1.9–3.7)
Glucose, Bld: 84 mg/dL (ref 65–99)
Potassium: 4.1 mmol/L (ref 3.5–5.3)
Sodium: 137 mmol/L (ref 135–146)
Total Bilirubin: 0.3 mg/dL (ref 0.2–1.2)
Total Protein: 8 g/dL (ref 6.1–8.1)
eGFR: 129 mL/min/1.73m2 (ref >=60)

## 2023-10-29 LAB — PROTIME-INR
INR: 0.9
Prothrombin Time: 10.3 s (ref 9.0–11.5)

## 2023-10-29 LAB — B12 AND FOLATE PANEL
Folate: 17.2 ng/mL
Vitamin B-12: 487 pg/mL (ref 200–1100)

## 2023-10-29 LAB — IRON,TIBC AND FERRITIN PANEL
%SAT: 9 % — ABNORMAL LOW (ref 16–45)
Ferritin: 20 ng/mL (ref 16–154)
Iron: 33 ug/dL — ABNORMAL LOW (ref 40–190)
TIBC: 386 ug/dL (ref 250–450)

## 2023-10-29 LAB — VITAMIN D 25 HYDROXY (VIT D DEFICIENCY, FRACTURES): Vit D, 25-Hydroxy: 32 ng/mL (ref 30–100)

## 2023-10-29 LAB — TSH: TSH: 1.4 m[IU]/L

## 2023-11-28 DIAGNOSIS — Z419 Encounter for procedure for purposes other than remedying health state, unspecified: Secondary | ICD-10-CM | POA: Diagnosis not present

## 2023-12-01 ENCOUNTER — Encounter: Payer: Self-pay | Admitting: Neurology

## 2023-12-01 ENCOUNTER — Ambulatory Visit: Payer: Medicaid Other | Admitting: Neurology

## 2023-12-01 VITALS — BP 96/63 | HR 80 | Ht 61.0 in | Wt 184.0 lb

## 2023-12-01 DIAGNOSIS — G43009 Migraine without aura, not intractable, without status migrainosus: Secondary | ICD-10-CM | POA: Diagnosis not present

## 2023-12-01 MED ORDER — SERTRALINE HCL 25 MG PO TABS: 25.0000 mg | ORAL_TABLET | Freq: Every day | ORAL | 4 refills | Status: AC

## 2023-12-01 NOTE — Patient Instructions (Signed)
 It's always a pleasure to see you.  A lower dose of Sertraline  was sent to the pharmacy: take Sertraline  25mg  daily. If migraines recur, call our office and we will go back up on dose  2. Follow-up in 1 year, call for any changes

## 2023-12-01 NOTE — Progress Notes (Signed)
 NEUROLOGY FOLLOW UP OFFICE NOTE  Miranda Prince [id number] Sep 22, 2000  HISTORY OF PRESENT ILLNESS: I had the pleasure of seeing Miranda Prince in follow-up in the neurology clinic on 12/01/2023.  The patient was last seen a year ago for migraines and dizziness. She is alone in the office today. Records and images were personally reviewed where available.  Since her last visit, she denies any migraines in the past year. Dizziness come and goes sometimes, no falls. She denies any vision changes, focal numbness/tingling/weakness. She gets 7 hours of sleep. Mood is good. She is on Sertraline  50mg  daily which had helped with migraines. No pregnancy plans.    History on Initial Assessment 06/10/2016: This is an 23 yo RH woman who presented with worsening headaches and dizziness. She reports symptoms started intermittently at age 75, but have gotten worse the past 4 weeks, now occurring on a daily basis. She has throbbing headaches mostly over the right parietal region, but also over the frontal/vertex region, with associated nausea and dizziness. She describes the dizziness as either the room spinning or lightheadedness when she walks. Initially they were occurring intermittently with good response to Ibuprofen , however she states that over the past 4 weeks, symptoms have been near-constant. She would have headaches on a daily basis with an intensity of 4 or 5 over 10. The dizziness comes and goes. She has difficulty stating the duration, they mostly worsen when she is doing activities, which has mad it difficult for her to do anything at school. She does report that her grades are fine. She does not take any medication now for these. There is no photo/phonophobia, vomiting, visual obscurations, tinnitus, focal numbness/tingling/weakness in her extremities, but she does have tingling on the right parietal region when it throbs. She has neck and back pain, no diplopia, dysarthria, bowel/bladder  dysfunction. She has noticed some trouble swallowing the past 2 weeks. Her mother used to have migraines. She has not had any brain imaging in the past. She reports mood is neutral. She reports good sleep except when she has headaches, appetite is good.   PAST MEDICAL HISTORY: Past Medical History:  Diagnosis Date   Anemia    Anxiety    Depression    Headache    History of premature rupture of membranes 04/08/2017   Labor and delivery, indication for care 07/12/2019   Postmaturity pregnancy, 40-[redacted] weeks gestation 08/27/2022   Postpartum care following vaginal delivery 09/04/2022   Retained placenta or amniotic membrane after delivery without hemorrhage 09/04/2022   Supervision of other normal pregnancy, antepartum 01/08/2022              Clinical Staff    Provider      Office Location     [facility]    Dating     Not found.      Language     English    Anatomy US      Normal female      Flu Vaccine     offer    Genetic Screen     NIPS:       TDaP vaccine     offer    Hgb A1C or   GTT    Early :  Third trimester : 105      Covid    declined         LAB RESULTS       Rhogam     O/Positive/-- (10/31 9047)     Blood Type  MEDICATIONS: Current Outpatient Medications on File Prior to Visit  Medication Sig Dispense Refill   ibuprofen  (ADVIL ) 800 MG tablet Take 1 tablet (800 mg total) by mouth every 8 (eight) hours as needed. Take 1 tablet (800mg ) every 8 hours starting the day before menstrual cycle starts and continue for first two days of cycle to reduce dysmennorhea 30 tablet 0   Iron , Ferrous Sulfate , 325 (65 Fe) MG TABS Take 325 mg by mouth daily. 90 tablet 1   sertraline  (ZOLOFT ) 50 MG tablet Take 1 tablet (50 mg total) by mouth daily. 90 tablet 4   No current facility-administered medications on file prior to visit.    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  Tobacco Use   Smoking status: Never    Passive exposure: Never   Smokeless tobacco: Never  Vaping Use   Vaping status: Former  Substance and Sexual Activity   Alcohol use: No   Drug use: No   Sexual activity: Yes    Partners: Male    Birth control/protection: Surgical    Comment: vasectomy  Other Topics Concern   Not on file  Social History Narrative   Right handed    Lives with family -1 story home   Just had a baby has 3 children   Social Drivers of Corporate investment banker Strain: Low Risk  (10/24/2023)   Overall Financial Resource Strain (CARDIA)    Difficulty of Paying Living Expenses: Not hard at all  Food Insecurity: No Food Insecurity (10/24/2023)   Hunger Vital Sign    Worried About Running Out of Food in the Last Year: Never true    Ran Out of Food in the Last Year: Never true  Transportation Needs: No Transportation Needs (10/24/2023)   PRAPARE - Administrator, Civil Service (Medical): No    Lack of Transportation (Non-Medical): No  Physical Activity: Insufficiently Active (10/24/2023)   Exercise Vital Sign    Days of Exercise per Week: 1 day    Minutes of Exercise per Session: 10 min  Stress: No Stress Concern Present (10/24/2023)   [employer] of Occupational Health - Occupational Stress Questionnaire    Feeling of Stress: Only a little  Social Connections: Moderately Integrated (10/24/2023)   Social Connection and Isolation Panel    Frequency of Communication with Friends and Family:  More than three times a week    Frequency of Social Gatherings with Friends and Family: More than three times a week    Attends Religious Services: More than 4 times per year    Active Member of [employer] or Organizations: No    Attends Banker Meetings: Not on file    Marital Status: Married  Intimate Partner Violence: Not At Risk (02/04/2023)   Humiliation, Afraid, Rape, and  Kick questionnaire    Fear of Current or Ex-Partner: No    Emotionally Abused: No    Physically Abused: No    Sexually Abused: No     PHYSICAL EXAM: Vitals:   12/01/23 1503  BP: 96/63  Pulse: 80  SpO2: 99%   General: No acute distress Head:  Normocephalic/atraumatic Skin/Extremities: No rash, no edema Neurological Exam: alert and awake. No aphasia or dysarthria. Fund of knowledge is appropriate.  Attention and concentration are normal.   Cranial nerves: Pupils equal, round. Extraocular movements intact with no nystagmus. Visual fields full.  No facial asymmetry.  Motor: Bulk and tone normal, muscle strength 5/5 throughout with no pronator drift.   Finger to nose testing intact.  Gait narrow-based and steady, able to tandem walk adequately.  Romberg negative.   IMPRESSION: This is a 23 yo RH woman with a history of headaches and dizziness since childhood suggestive of migraines without aura, who initially presented in 2018 with a change in headaches and dizziness that were occurring on a daily basis. MRI brain normal in 2018. She had an excellent response to Sertraline  with no migraines in the past year. We agreed to reduce dose to 25mg  daily and monitor symptoms on lower dose. Follow-up in 1 year, call for any changes.    Thank you for allowing me to participate in her care.  Please do not hesitate to call for any questions or concerns.    Darice Shivers, M.D.   CC: Mliss Spray, FNP

## 2024-02-04 ENCOUNTER — Encounter: Payer: Self-pay | Admitting: Neurology

## 2024-02-06 ENCOUNTER — Encounter: Payer: Self-pay | Admitting: Nurse Practitioner

## 2024-02-18 ENCOUNTER — Ambulatory Visit: Admitting: Nurse Practitioner

## 2024-02-18 ENCOUNTER — Encounter: Payer: Self-pay | Admitting: Nurse Practitioner

## 2024-02-18 VITALS — BP 130/68 | HR 97 | Temp 98.0°F | Ht 61.0 in | Wt 187.0 lb

## 2024-02-18 DIAGNOSIS — Z0000 Encounter for general adult medical examination without abnormal findings: Secondary | ICD-10-CM | POA: Diagnosis not present

## 2024-02-18 NOTE — Progress Notes (Signed)
 Name: Miranda Prince   MRN: [id number]    DOB: Feb 14, 2001   Date:02/18/2024       Progress Note  Subjective  Chief Complaint  Chief Complaint  Patient presents with   Annual Exam    HPI  Patient presents for annual CPE. Discussed the use of AI scribe software for clinical note transcription with the patient, who gave verbal consent to proceed.  History of Present Illness Miranda Prince is a 23 year old female who presents for a routine follow-up visit.  Iron  deficiency - Currently taking iron  supplements without adverse effects - Increased intake of iron -rich foods - Recent laboratory results show low iron  levels - Vitamin B12, folate, and vitamin D  levels are within normal limits  Vital signs and cardiovascular health - Blood pressure measured at 130/68 mmHg - Stable weight  Menstrual and gynecologic health - Last menstrual period on November 21st - No issues with incontinence - Up to date on pap smear  Mood and mental health - Good mood - No thoughts of self-harm or harm to others  Lifestyle and preventive health - Engages in physical activity by exercising in her living room and walking up her street one to two times per week - Obtains seven to eight hours of sleep per night - Sexually active - No history of violence at home - Non-smoker  Ophthalmologic and dental health - No visual complaints - No recent eye examination - Last dental examination approximately two years ago    Diet: well balanced diet Exercise: walking, exercising in living room 1-2 times per week.  ,recommend 150 min of physical activity weekly   Sleep: 7-8 hours Last dental exam: 2 years ago Last eye exam: long time, no eye issues  Flowsheet Row Office Visit from 02/18/2024 in [facility]  AUDIT-C Score 0   Depression: Phq 9 is  negative    02/18/2024    1:27 PM 10/24/2023    8:49 AM 04/02/2023    9:38 AM 02/04/2023   10:01 AM 08/18/2019    3:11  PM  Depression screen PHQ 2/9  Decreased Interest 0 0 0 0 0  Down, Depressed, Hopeless 0 0 0 0 0  PHQ - 2 Score 0 0 0 0 0  Altered sleeping 0   1 1  Tired, decreased energy 0   1 0  Change in appetite 0   2 1  Feeling bad or failure about yourself  0   0 0  Trouble concentrating 0   0 0  Moving slowly or fidgety/restless 0   0 0  Suicidal thoughts 0   0 0  PHQ-9 Score 0   4  2   Difficult doing work/chores Not difficult at all    Not difficult at all     Data saved with a previous flowsheet row definition   Hypertension: BP Readings from Last 3 Encounters:  02/18/24 130/68  12/01/23 96/63  10/24/23 106/72   Obesity: Wt Readings from Last 3 Encounters:  02/18/24 187 lb (84.8 kg)  12/01/23 184 lb (83.5 kg)  10/24/23 188 lb (85.3 kg)   BMI Readings from Last 3 Encounters:  02/18/24 35.33 kg/m  12/01/23 34.77 kg/m  10/24/23 35.52 kg/m     Vaccines:  HPV: up to at age 23 , ask insurance if age between 71-45  Shingrix: 29-64 yo and ask insurance if covered when patient above 60 yo Pneumonia:  educated and discussed with patient. Flu:  educated  and discussed with patient.  Hep C Screening: completed STD testing and prevention (HIV/chl/gon/syphilis): completed Intimate partner violence:none Sexual History : sexually active Menstrual History/LMP/Abnormal Bleeding: [facility]: 02/06/2024 Incontinence Symptoms: none  Breast cancer:  - Last Mammogram: does not qualify - BRCA gene screening: none  Osteoporosis: Discussed high calcium and vitamin D  supplementation, weight bearing exercises  Cervical cancer screening: 02/07/2023  Skin cancer: Discussed monitoring for atypical lesions  Colorectal cancer: does not qualify   Lung cancer:   Low Dose CT Chest recommended if Age 16-80 years, 20 pack-year currently smoking OR have quit w/in 15years. Patient does not qualify.   ECG: 10/30/2020  Advanced Care Planning: A voluntary discussion about advance care planning including the  explanation and discussion of advance directives.  Discussed health care proxy and Living will, and the patient was able to identify a health care proxy as Husband.  Patient does not have a living will at present time. If patient does have living will, I have requested they bring this to the clinic to be scanned in to their chart.  Lipids: Lab Results  Component Value Date   CHOL 191 02/04/2023   CHOL 152 01/19/2020   Lab Results  Component Value Date   HDL 42 (L) 02/04/2023   HDL 36 (L) 01/19/2020   Lab Results  Component Value Date   LDLCALC 124 (H) 02/04/2023   LDLCALC 94 01/19/2020   Lab Results  Component Value Date   TRIG 135 02/04/2023   TRIG 120 01/19/2020   Lab Results  Component Value Date   CHOLHDL 4.5 02/04/2023   CHOLHDL 4.2 01/19/2020   No results found for: LDLDIRECT  Glucose: Glucose, Bld  Date Value Ref Range Status  10/24/2023 84 65 - 99 mg/dL Final    Comment:    .            Fasting reference interval .   02/04/2023 100 (H) 65 - 99 mg/dL Final    Comment:    .            Fasting reference interval . For someone without known diabetes, a glucose value between 100 and 125 mg/dL is consistent with prediabetes and should be confirmed with a follow-up test. .   09/07/2022 82 70 - 99 mg/dL Final    Comment:    Glucose reference range applies only to samples taken after fasting for at least 8 hours.    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 No pertinent surgical history.  Family History  Problem Relation Age of Onset   Diabetes Mother    Seizures Maternal Grandmother    Stroke Paternal Grandfather    Diabetes Maternal Uncle    Depression Paternal Aunt    Asthma Paternal Aunt    COPD Paternal Aunt    Anxiety disorder Paternal Aunt    Diabetes Other    Cancer  Other     Social History   Socioeconomic History   Marital status: Married    Spouse name: Jordan   Number of children: 3   Years of education: 12   Highest education level: 12th grade  Occupational History   Occupation: lexicographer houses c a it consultant  Tobacco Use   Smoking status: Never    Passive exposure: Never   Smokeless tobacco: Never  Vaping Use  Vaping status: Former  Substance and Sexual Activity   Alcohol use: No   Drug use: No   Sexual activity: Yes    Partners: Male    Birth control/protection: Surgical    Comment: vasectomy  Other Topics Concern   Not on file  Social History Narrative   Right handed    Lives with family -1 story home   Just had a baby has 3 children   Social Drivers of Corporate Investment Banker Strain: Low Risk  (02/18/2024)   Overall Financial Resource Strain (CARDIA)    Difficulty of Paying Living Expenses: Not hard at all  Food Insecurity: No Food Insecurity (10/24/2023)   Hunger Vital Sign    Worried About Running Out of Food in the Last Year: Never true    Ran Out of Food in the Last Year: Never true  Transportation Needs: No Transportation Needs (10/24/2023)   PRAPARE - Administrator, Civil Service (Medical): No    Lack of Transportation (Non-Medical): No  Physical Activity: Insufficiently Active (02/18/2024)   Exercise Vital Sign    Days of Exercise per Week: 2 days    Minutes of Exercise per Session: 10 min  Stress: No Stress Concern Present (10/24/2023)   [employer] of Occupational Health - Occupational Stress Questionnaire    Feeling of Stress: Only a little  Social Connections: Moderately Integrated (02/18/2024)   Social Connection and Isolation Panel    Frequency of Communication with Friends and Family: More than three times a week    Frequency of Social Gatherings with Friends and Family: More than three times a week    Attends Religious Services: More than 4 times per year    Active Member of [employer]  or Organizations: No    Attends Banker Meetings: Never    Marital Status: Married  Catering Manager Violence: Not At Risk (02/04/2023)   Humiliation, Afraid, Rape, and Kick questionnaire    Fear of Current or Ex-Partner: No    Emotionally Abused: No    Physically Abused: No    Sexually Abused: No     Current Outpatient Medications:    ibuprofen  (ADVIL ) 800 MG tablet, Take 1 tablet (800 mg total) by mouth every 8 (eight) hours as needed. Take 1 tablet (800mg ) every 8 hours starting the day before menstrual cycle starts and continue for first two days of cycle to reduce dysmennorhea, Disp: 30 tablet, Rfl: 0   Iron , Ferrous Sulfate , 325 (65 Fe) MG TABS, Take 325 mg by mouth daily., Disp: 90 tablet, Rfl: 1   sertraline  (ZOLOFT ) 25 MG tablet, Take 1 tablet (25 mg total) by mouth daily., Disp: 90 tablet, Rfl: 4  No Known Allergies   ROS  Constitutional: Negative for fever or weight change.  Respiratory: Negative for cough and shortness of breath.   Cardiovascular: Negative for chest pain or palpitations.  Gastrointestinal: Negative for abdominal pain, no bowel changes.  Musculoskeletal: Negative for gait problem or joint swelling.  Skin: Negative for rash.  Neurological: Negative for dizziness or headache.  No other specific complaints in a complete review of systems (except as listed in HPI above).   Objective  Vitals:   02/18/24 1301  BP: 130/68  Pulse: 97  Temp: 98 F (36.7 C)  SpO2: 97%  Weight: 187 lb (84.8 kg)  Height: 5' 1 (1.549 m)    Body mass index is 35.33 kg/m.  Physical Exam Vitals reviewed.  Constitutional:      Appearance:  Normal appearance.  HENT:     Head: Normocephalic.     Right Ear: Tympanic membrane normal.     Left Ear: Tympanic membrane normal.     Nose: Nose normal.  Eyes:     Extraocular Movements: Extraocular movements intact.     Conjunctiva/sclera: Conjunctivae normal.     Pupils: Pupils are equal, round, and reactive to  light.  Neck:     Thyroid : No thyroid  mass, thyromegaly or thyroid  tenderness.  Cardiovascular:     Rate and Rhythm: Normal rate and regular rhythm.     Pulses: Normal pulses.     Heart sounds: Normal heart sounds.  Pulmonary:     Effort: Pulmonary effort is normal.     Breath sounds: Normal breath sounds.  Abdominal:     General: Bowel sounds are normal.     Palpations: Abdomen is soft.  Musculoskeletal:        General: Normal range of motion.     Cervical back: Normal range of motion and neck supple.     Right lower leg: No edema.     Left lower leg: No edema.  Skin:    General: Skin is warm and dry.     Capillary Refill: Capillary refill takes less than 2 seconds.  Neurological:     General: No focal deficit present.     Mental Status: She is alert and oriented to person, place, and time. Mental status is at baseline.  Psychiatric:        Mood and Affect: Mood normal.        Behavior: Behavior normal.        Thought Content: Thought content normal.        Judgment: Judgment normal.        Fall Risk:    12/01/2023    3:02 PM 10/24/2023    8:48 AM 04/02/2023    9:38 AM 02/04/2023   10:00 AM 12/10/2022    2:45 PM  Fall Risk   Falls in the past year? 0 0 0 0 0  Number falls in past yr: 0 0  0 0  Injury with Fall? 0  0   0  0   Risk for fall due to :  No Fall Risks  No Fall Risks   Follow up Falls evaluation completed Falls evaluation completed  Falls prevention discussed Falls evaluation completed     Data saved with a previous flowsheet row definition     Functional Status Survey: Is the patient deaf or have difficulty hearing?: No Does the patient have difficulty seeing, even when wearing glasses/contacts?: No Does the patient have difficulty concentrating, remembering, or making decisions?: No Does the patient have difficulty walking or climbing stairs?: No Does the patient have difficulty dressing or bathing?: No Does the patient have difficulty doing errands  alone such as visiting a doctor's office or shopping?: No   Assessment & Plan  Problem List Items Addressed This Visit   None Visit Diagnoses       Annual physical exam    -  Primary      Assessment and Plan Assessment & Plan Adult Wellness Visit Routine adult wellness visit with well-managed weight, borderline blood pressure at 130/68, and no concerns. Mood is stable with no suicidal ideation. No issues with incontinence. Up to date on Pap smear. Not a smoker. Functional status is intact with ability to perform daily activities independently.  Iron  deficiency Mild iron  deficiency with iron  level at 33 (normal  40 and above). Previously prescribed iron  supplements, which were well tolerated. Dietary intake of iron  has been increased. - Continue iron  supplementation as previously prescribed. - Encouraged dietary intake of iron -rich foods.    -USPSTF grade A and B recommendations reviewed with patient; age-appropriate recommendations, preventive care, screening tests, etc discussed and encouraged; healthy living encouraged; see AVS for patient education given to patient -Discussed importance of 150 minutes of physical activity weekly, eat two servings of fish weekly, eat one serving of tree nuts ( cashews, pistachios, pecans, almonds.SABRA) every other day, eat 6 servings of fruit/vegetables daily and drink plenty of water and avoid sweet beverages.   -Reviewed Health Maintenance: yes

## 2024-02-27 DIAGNOSIS — Z419 Encounter for procedure for purposes other than remedying health state, unspecified: Secondary | ICD-10-CM | POA: Diagnosis not present

## 2024-03-30 ENCOUNTER — Telehealth: Payer: Self-pay | Admitting: Nurse Practitioner

## 2024-03-30 NOTE — Telephone Encounter (Signed)
 Copied from CRM [id number]. Topic: Clinical - Refused Triage >> Mar 30, 2024  8:49 AM Olam RAMAN wrote: Patient/caller voiced complaints of ankle pain. Declined transfer to triage.

## 2024-03-31 ENCOUNTER — Ambulatory Visit: Admitting: Nurse Practitioner

## 2024-03-31 VITALS — BP 116/78 | HR 92 | Temp 98.0°F | Ht 61.0 in | Wt 191.0 lb

## 2024-03-31 DIAGNOSIS — M25572 Pain in left ankle and joints of left foot: Secondary | ICD-10-CM

## 2024-03-31 DIAGNOSIS — M779 Enthesopathy, unspecified: Secondary | ICD-10-CM

## 2024-03-31 MED ORDER — PREDNISONE 10 MG (21) PO TBPK: ORAL_TABLET | ORAL | 0 refills | Status: AC

## 2024-03-31 NOTE — Progress Notes (Signed)
 "  BP 116/78   Pulse 92   Temp 98 F (36.7 C)   Ht 5' 1 (1.549 m)   Wt 191 lb (86.6 kg)   SpO2 97%   BMI 36.09 kg/m    Subjective:    Patient ID: Miranda Prince, female    DOB: Jun 01, 2000, 24 y.o.   MRN: [id number]  HPI: Miranda Prince is a 24 y.o. female  Chief Complaint  Patient presents with   Ankle Pain    Pt c/o left ankle pain x3 weeks.    Discussed the use of AI scribe software for clinical note transcription with the patient, who gave verbal consent to proceed.  History of Present Illness Miranda Prince is a 24 year old female who presents with left ankle pain for three weeks.  Left ankle pain - Pain present for three weeks - Pain primarily located in the leg rather than the foot - No swelling in the left ankle - Pain does not worsen with walking - Pain sometimes described as very painful - No pain in other areas of the foot or ankle - No treatments or medications used for this condition         02/18/2024    1:27 PM 10/24/2023    8:49 AM 04/02/2023    9:38 AM  Depression screen PHQ 2/9  Decreased Interest 0 0 0  Down, Depressed, Hopeless 0 0 0  PHQ - 2 Score 0 0 0  Altered sleeping 0    Tired, decreased energy 0    Change in appetite 0    Feeling bad or failure about yourself  0    Trouble concentrating 0    Moving slowly or fidgety/restless 0    Suicidal thoughts 0    PHQ-9 Score 0    Difficult doing work/chores Not difficult at all      Relevant past medical, surgical, family and social history reviewed and updated as indicated. Interim medical history since our last visit reviewed. Allergies and medications reviewed and updated.  Review of Systems  Ten systems reviewed and is negative except as mentioned in HPI      Objective:      BP 116/78   Pulse 92   Temp 98 F (36.7 C)   Ht 5' 1 (1.549 m)   Wt 191 lb (86.6 kg)   SpO2 97%   BMI 36.09 kg/m    Wt Readings from Last 3 Encounters:  03/31/24 191 lb (86.6 kg)   02/18/24 187 lb (84.8 kg)  12/01/23 184 lb (83.5 kg)    Physical Exam GENERAL: Alert, cooperative, well developed, no acute distress HEENT: Normocephalic, normal oropharynx, moist mucous membranes CHEST: Clear to auscultation bilaterally, no wheezes, rhonchi, or crackles CARDIOVASCULAR: Normal heart rate and rhythm, S1 and S2 normal without murmurs ABDOMEN: Soft, non-tender, non-distended, without organomegaly, normal bowel sounds EXTREMITIES: No cyanosis or edema, left ankle tender, no swelling NEUROLOGICAL: Cranial nerves grossly intact, moves all extremities without gross motor or sensory deficit  Results for orders placed or performed in visit on 10/24/23  CBC with Differential/Platelet   Collection Time: 10/24/23  9:15 AM  Result Value Ref Range   WBC 6.7 3.8 - 10.8 Thousand/uL   RBC 4.45 3.80 - 5.10 Million/uL   Hemoglobin 11.9 11.7 - 15.5 g/dL   HCT 61.9 64.9 - 54.9 %   MCV 85.4 80.0 - 100.0 fL   MCH 26.7 (L) 27.0 - 33.0 pg   MCHC 31.3 (L) 32.0 -  36.0 g/dL   RDW 86.9 88.9 - 84.9 %   Platelets 361 140 - 400 Thousand/uL   MPV 10.6 7.5 - 12.5 fL   Neutro Abs 3,879 1,500 - 7,800 cells/uL   Absolute Lymphocytes 1,990 850 - 3,900 cells/uL   Absolute Monocytes 503 200 - 950 cells/uL   Eosinophils Absolute 268 15 - 500 cells/uL   Basophils Absolute 60 0 - 200 cells/uL   Neutrophils Relative % 57.9 %   Total Lymphocyte 29.7 %   Monocytes Relative 7.5 %   Eosinophils Relative 4.0 %   Basophils Relative 0.9 %  Comprehensive metabolic panel with GFR   Collection Time: 10/24/23  9:15 AM  Result Value Ref Range   Glucose, Bld 84 65 - 99 mg/dL   BUN 11 7 - 25 mg/dL   Creat 9.41 9.49 - 9.03 mg/dL   eGFR 870 > OR = 60 fO/fpw/8.26f7   BUN/Creatinine Ratio SEE NOTE: 6 - 22 (calc)   Sodium 137 135 - 146 mmol/L   Potassium 4.1 3.5 - 5.3 mmol/L   Chloride 102 98 - 110 mmol/L   CO2 26 20 - 32 mmol/L   Calcium 9.5 8.6 - 10.2 mg/dL   Total Protein 8.0 6.1 - 8.1 g/dL   Albumin 4.6  3.6 - 5.1 g/dL   Globulin 3.4 1.9 - 3.7 g/dL (calc)   AG Ratio 1.4 1.0 - 2.5 (calc)   Total Bilirubin 0.3 0.2 - 1.2 mg/dL   Alkaline phosphatase (APISO) 75 31 - 125 U/L   AST 18 10 - 30 U/L   ALT 23 6 - 29 U/L  TSH   Collection Time: 10/24/23  9:15 AM  Result Value Ref Range   TSH 1.40 mIU/L  VITAMIN D  25 Hydroxy (Vit-D Deficiency, Fractures)   Collection Time: 10/24/23  9:15 AM  Result Value Ref Range   Vit D, 25-Hydroxy 32 30 - 100 ng/mL  VON WILLEBRAND COMPREHENSIVE PANEL   Collection Time: 10/24/23  9:15 AM  Result Value Ref Range   INTERPRETATION see note    aPTT 30 23 - 32 sec   Factor-VIII Activity 78 50 - 180 % normal   Von Willebrand Antigen, Plasma 77 50 - 217 %   Ristocetin Co-Factor 46 42 - 200 % normal   Von Willebrand Multimers see note   Protime-INR   Collection Time: 10/24/23  9:15 AM  Result Value Ref Range   INR 0.9    Prothrombin Time 10.3 9.0 - 11.5 sec  Iron , TIBC and Ferritin Panel   Collection Time: 10/24/23  9:15 AM  Result Value Ref Range   Iron  33 (L) 40 - 190 mcg/dL   TIBC 613 749 - 549 mcg/dL (calc)   %SAT 9 (L) 16 - 45 % (calc)   Ferritin 20 16 - 154 ng/mL  B12 and Folate Panel   Collection Time: 10/24/23  9:15 AM  Result Value Ref Range   Vitamin B-12 487 200 - 1,100 pg/mL   Folate 17.2 ng/mL          Assessment & Plan:   Problem List Items Addressed This Visit   None Visit Diagnoses       Acute left ankle pain    -  Primary   Relevant Medications   predniSONE  (STERAPRED UNI-PAK 21 TAB) 10 MG (21) TBPK tablet   Other Relevant Orders   Ambulatory referral to Podiatry     Tendinitis       Relevant Medications   predniSONE  (STERAPRED UNI-PAK  21 TAB) 10 MG (21) TBPK tablet   Other Relevant Orders   Ambulatory referral to Podiatry        Assessment and Plan Assessment & Plan Left ankle tendinitis Left ankle pain for three weeks, likely tendinitis due to tenderness and absence of swelling or known injury. No pain during  ambulation. Differential diagnosis includes tendinitis, with fracture considered unlikely due to lack of swelling and known injury. - Prescribed oral steroids to reduce inflammation and pain. - Referred to podiatry for further evaluation and possible corticosteroid injections if oral steroids are ineffective. - Advised to schedule podiatry appointment if contacted, with option to cancel if symptoms resolve. - Instructed to contact provider if symptoms do not improve and plan to proceed with podiatry.        Follow up plan: Return if symptoms worsen or fail to improve. "

## 2024-04-09 ENCOUNTER — Ambulatory Visit: Payer: Self-pay | Admitting: Podiatry

## 2024-11-30 ENCOUNTER — Ambulatory Visit: Admitting: Neurology

## 2025-02-18 ENCOUNTER — Encounter: Admitting: Nurse Practitioner
